# Patient Record
Sex: Female | Born: 1965 | Race: White | Hispanic: No | Marital: Married | Smoking: Never smoker
Health system: Southern US, Community
[De-identification: ages and names within clinical notes are randomized; demographics above are authoritative.]

## PROBLEM LIST (undated history)

## (undated) DIAGNOSIS — I341 Nonrheumatic mitral (valve) prolapse: Secondary | ICD-10-CM

## (undated) DIAGNOSIS — R011 Cardiac murmur, unspecified: Secondary | ICD-10-CM

## (undated) DIAGNOSIS — E039 Hypothyroidism, unspecified: Secondary | ICD-10-CM

## (undated) DIAGNOSIS — E785 Hyperlipidemia, unspecified: Secondary | ICD-10-CM

## (undated) DIAGNOSIS — E049 Nontoxic goiter, unspecified: Secondary | ICD-10-CM

## (undated) DIAGNOSIS — Z803 Family history of malignant neoplasm of breast: Secondary | ICD-10-CM

## (undated) DIAGNOSIS — G43109 Migraine with aura, not intractable, without status migrainosus: Secondary | ICD-10-CM

## (undated) HISTORY — PX: DILATION AND CURETTAGE OF UTERUS: SHX78

## (undated) HISTORY — DX: Family history of malignant neoplasm of breast: Z80.3

## (undated) HISTORY — DX: Hyperlipidemia, unspecified: E78.5

## (undated) HISTORY — DX: Nontoxic goiter, unspecified: E04.9

## (undated) HISTORY — DX: Hypothyroidism, unspecified: E03.9

## (undated) HISTORY — DX: Migraine with aura, not intractable, without status migrainosus: G43.109

---

## 2004-01-28 HISTORY — PX: THYROIDECTOMY, PARTIAL: SHX18

## 2004-02-21 ENCOUNTER — Ambulatory Visit: Payer: Self-pay

## 2004-08-05 ENCOUNTER — Ambulatory Visit: Payer: Self-pay | Admitting: Unknown Physician Specialty

## 2005-10-21 ENCOUNTER — Ambulatory Visit: Payer: Self-pay

## 2006-12-09 ENCOUNTER — Ambulatory Visit: Payer: Self-pay

## 2008-02-03 ENCOUNTER — Ambulatory Visit: Payer: Self-pay

## 2009-02-08 ENCOUNTER — Ambulatory Visit: Payer: Self-pay

## 2010-02-13 ENCOUNTER — Ambulatory Visit: Payer: Self-pay

## 2011-03-06 ENCOUNTER — Ambulatory Visit: Payer: Self-pay

## 2012-05-19 ENCOUNTER — Ambulatory Visit: Payer: Self-pay

## 2013-07-21 ENCOUNTER — Ambulatory Visit: Payer: Self-pay

## 2014-06-14 ENCOUNTER — Other Ambulatory Visit: Payer: Self-pay | Admitting: Certified Nurse Midwife

## 2014-06-14 DIAGNOSIS — Z1231 Encounter for screening mammogram for malignant neoplasm of breast: Secondary | ICD-10-CM

## 2014-07-24 ENCOUNTER — Ambulatory Visit
Admission: RE | Admit: 2014-07-24 | Discharge: 2014-07-24 | Disposition: A | Discharge status: Home / Self Care | Payer: BLUE CROSS/BLUE SHIELD | Source: Ambulatory Visit | Attending: Certified Nurse Midwife | Admitting: Certified Nurse Midwife

## 2014-07-24 DIAGNOSIS — Z1231 Encounter for screening mammogram for malignant neoplasm of breast: Secondary | ICD-10-CM | POA: Insufficient documentation

## 2015-06-28 ENCOUNTER — Other Ambulatory Visit: Payer: Self-pay | Admitting: Certified Nurse Midwife

## 2015-06-28 DIAGNOSIS — Z1231 Encounter for screening mammogram for malignant neoplasm of breast: Secondary | ICD-10-CM

## 2015-07-25 ENCOUNTER — Other Ambulatory Visit: Payer: Self-pay | Admitting: Certified Nurse Midwife

## 2015-07-25 ENCOUNTER — Ambulatory Visit
Admission: RE | Admit: 2015-07-25 | Discharge: 2015-07-25 | Disposition: A | Discharge status: Home / Self Care | Payer: BLUE CROSS/BLUE SHIELD | Source: Ambulatory Visit | Attending: Certified Nurse Midwife | Admitting: Certified Nurse Midwife

## 2015-07-25 DIAGNOSIS — Z1231 Encounter for screening mammogram for malignant neoplasm of breast: Secondary | ICD-10-CM

## 2016-06-20 ENCOUNTER — Other Ambulatory Visit: Payer: Self-pay | Admitting: Certified Nurse Midwife

## 2016-06-26 ENCOUNTER — Encounter: Payer: Self-pay | Admitting: Certified Nurse Midwife

## 2016-06-26 ENCOUNTER — Ambulatory Visit (INDEPENDENT_AMBULATORY_CARE_PROVIDER_SITE_OTHER): Payer: BLUE CROSS/BLUE SHIELD | Admitting: Certified Nurse Midwife

## 2016-06-26 VITALS — BP 110/70 | HR 69 | Ht 69.5 in | Wt 180.0 lb

## 2016-06-26 DIAGNOSIS — Z1231 Encounter for screening mammogram for malignant neoplasm of breast: Secondary | ICD-10-CM | POA: Diagnosis not present

## 2016-06-26 DIAGNOSIS — Z803 Family history of malignant neoplasm of breast: Secondary | ICD-10-CM | POA: Diagnosis not present

## 2016-06-26 DIAGNOSIS — Z1211 Encounter for screening for malignant neoplasm of colon: Secondary | ICD-10-CM

## 2016-06-26 DIAGNOSIS — Z01419 Encounter for gynecological examination (general) (routine) without abnormal findings: Secondary | ICD-10-CM

## 2016-06-26 DIAGNOSIS — Z1239 Encounter for other screening for malignant neoplasm of breast: Secondary | ICD-10-CM

## 2016-06-26 DIAGNOSIS — Z124 Encounter for screening for malignant neoplasm of cervix: Secondary | ICD-10-CM

## 2016-06-26 MED ORDER — LORYNA 3-0.02 MG PO TABS: 1.0000 | ORAL_TABLET | Freq: Every day | ORAL | 3 refills | Status: DC

## 2016-06-29 ENCOUNTER — Encounter: Payer: Self-pay | Admitting: Certified Nurse Midwife

## 2016-06-29 DIAGNOSIS — Z803 Family history of malignant neoplasm of breast: Secondary | ICD-10-CM | POA: Insufficient documentation

## 2016-06-29 DIAGNOSIS — E039 Hypothyroidism, unspecified: Secondary | ICD-10-CM | POA: Insufficient documentation

## 2016-06-29 DIAGNOSIS — E785 Hyperlipidemia, unspecified: Secondary | ICD-10-CM | POA: Insufficient documentation

## 2016-06-29 NOTE — Progress Notes (Signed)
Gynecology Annual Exam  PCP: Lenard Simmer, MD  Chief Complaint:  Chief Complaint  Patient presents with  . Gynecologic Exam    History of Present Illness: Katherine Montgomery is a 51 y.o. G0P0000 who presents for her  annual exam. The patient has no complaints today.  Her menses are irregular, as she at times has no withdrawal bleeding on her BCPs. Her flow is light, when she does have a withdrawal bleed, lasting 4-5 days. She does not have intermenstrual bleeding. Her last menstrual period was 06/21/2016. She denies dysmenorrhea. Last pap smear: 06/04/2015, results were NIL No history of abnormal Pap smears  The patient is sexually active. She currently uses [place] BCPs  for contraception. She does not have dyspareunia.  Since her last visit, she has had no significant changes in her health.  Her past medical history is remarkable for hypothyroidism and hyperlipidemia  The patient does not perform self breast exams. Her last mammogram was 07/26/15, results were negative.   There is a family history of breast cancer in her mother Genetic testing has not been done.  There is no family history of ovarian cancer.  The patient denies smoking.  She drinks alcohol occasionally.   She denies illegal drug use.  The patient reports exercising regularly.  The patient denies current symptoms of depression.    Review of Systems: Review of Systems  Constitutional: Negative for chills, fever and weight loss.  HENT: Negative for congestion, sinus pain and sore throat.   Eyes: Negative for blurred vision and pain.  Respiratory: Negative for hemoptysis, shortness of breath and wheezing.   Cardiovascular: Negative for chest pain, palpitations and leg swelling.  Gastrointestinal: Negative for abdominal pain, blood in stool, diarrhea, heartburn, nausea and vomiting.  Genitourinary: Negative for dysuria, frequency, hematuria and urgency.       Positive for irregular menses    Musculoskeletal: Negative for back pain, joint pain and myalgias.  Skin: Negative for itching and rash.  Neurological: Negative for dizziness, tingling and headaches.  Endo/Heme/Allergies: Negative for environmental allergies and polydipsia. Does not bruise/bleed easily.       Negative for hirsutism and hot flashes   Psychiatric/Behavioral: Negative for depression. The patient is not nervous/anxious and does not have insomnia.     Past Medical History:  Past Medical History:  Diagnosis Date  . Family history of breast cancer    mother age 10 - pt's lifetime risk is 27.3%  . Goiter   . Hypothyroid     Past Surgical History:  Past Surgical History:  Procedure Laterality Date  . THYROIDECTOMY, PARTIAL  2006   left hemithyroidectomy    Family History:  Family History  Problem Relation Age of Onset  . Breast cancer Mother 81  . Brain cancer Father 71  . Colon cancer Paternal Aunt 78  . Colon cancer Maternal Grandfather 80    Social History:  Social History   Social History  . Marital status: Married    Spouse name: N/A  . Number of children: 0  . Years of education: N/A   Occupational History  . Banker    Social History Main Topics  . Smoking status: Never Smoker  . Smokeless tobacco: Never Used  . Alcohol use Yes  . Drug use: No  . Sexual activity: Yes    Partners: Male    Birth control/ protection: Pill   Other Topics Concern  . Not on file   Social History Narrative  . No  narrative on file    Allergies:  No Known Allergies  Medications: Prior to Admission medications   Medication Sig Start Date End Date Taking? Authorizing Provider  levothyroxine (SYNTHROID, LEVOTHROID) 100 MCG tablet Take 100 mcg by mouth daily before breakfast.   Yes [provider]  LORYNA 3-0.02 MG tablet Take 1 tablet by mouth daily. 06/26/16   Dalia Heading, CNM    Physical Exam Vitals: Blood pressure 110/70, pulse 69, height 5' 9.5" (1.765 m), weight 180 lb  (81.6 kg), last menstrual period 06/21/2016.  General: pleasant WF in NAD HEENT: normocephalic, anicteric Neck: no thyroid enlargement, no palpable nodules, no cervical lymphadenopathy  Pulmonary: No increased work of breathing, CTAB Cardiovascular: RRR, without murmur  Breast: Breast symmetrical, no tenderness, no palpable nodules or masses, no skin or nipple retraction present, no nipple discharge.  No axillary, infraclavicular or supraclavicular lymphadenopathy. Abdomen: Soft, non-tender, non-distended.  Umbilicus without lesions.  No hepatomegaly or masses palpable. No evidence of hernia. Genitourinary:  External: Normal external female genitalia.  Normal urethral meatus, normal Bartholin's and Skene's glands.    Vagina: Normal vaginal mucosa, no evidence of prolapse.    Cervix: Grossly normal in appearance, no bleeding, non-tender  Uterus: Anteverted, normal size, shape, and consistency, mobile, and non-tender  Adnexa: No adnexal masses, non-tender  Rectal: deferred  Lymphatic: no evidence of inguinal lymphadenopathy Extremities: no edema, erythema, or tenderness Neurologic: Grossly intact Psychiatric: mood appropriate, affect full     Assessment: 51 y.o. G0P0000 well woman exam  Plan:   1) Breast cancer screening - recommend monthly self breast exam and annual 3D screening mammograms Mammogram was ordered today. Patient to schedule for after 6/28.   2) Cervical cancer screening - Pap was done. ASCCP guidelines and rational discussed.    3) Contraception - Refill Loryna x1 year  4) Routine healthcare maintenance including cholesterol and diabetes screening managed by PCP   5) Colon cancer screening-will have patient collect FIT test at home. Declines colonoscopy referral.  Dalia Heading, CNM

## 2016-06-30 LAB — IGP, APTIMA HPV
HPV APTIMA: NEGATIVE
PAP SMEAR COMMENT: 0

## 2016-07-03 ENCOUNTER — Other Ambulatory Visit: Payer: Self-pay | Admitting: Certified Nurse Midwife

## 2016-07-03 DIAGNOSIS — Z1211 Encounter for screening for malignant neoplasm of colon: Secondary | ICD-10-CM

## 2016-07-03 NOTE — Progress Notes (Signed)
Pt aware.

## 2016-07-31 ENCOUNTER — Other Ambulatory Visit: Payer: Self-pay | Admitting: Ophthalmology

## 2016-07-31 DIAGNOSIS — G453 Amaurosis fugax: Secondary | ICD-10-CM

## 2016-08-06 ENCOUNTER — Other Ambulatory Visit
Admission: RE | Admit: 2016-08-06 | Discharge: 2016-08-06 | Disposition: A | Discharge status: Home / Self Care | Payer: BLUE CROSS/BLUE SHIELD | Source: Ambulatory Visit | Attending: Ophthalmology | Admitting: Ophthalmology

## 2016-08-06 ENCOUNTER — Ambulatory Visit
Admission: RE | Admit: 2016-08-06 | Discharge: 2016-08-06 | Disposition: A | Discharge status: Home / Self Care | Payer: BLUE CROSS/BLUE SHIELD | Source: Ambulatory Visit | Attending: Ophthalmology | Admitting: Ophthalmology

## 2016-08-06 DIAGNOSIS — G453 Amaurosis fugax: Secondary | ICD-10-CM | POA: Diagnosis not present

## 2016-08-06 LAB — C-REACTIVE PROTEIN: CRP: 1 mg/dL — AB (ref <1.0)

## 2016-08-06 LAB — PLATELET COUNT: PLATELETS: 282 10*3/uL (ref 150–440)

## 2016-08-06 LAB — SEDIMENTATION RATE: Sed Rate: 43 mm/hr — ABNORMAL HIGH (ref 0–30)

## 2016-09-18 ENCOUNTER — Ambulatory Visit
Admission: RE | Admit: 2016-09-18 | Discharge: 2016-09-18 | Disposition: A | Discharge status: Home / Self Care | Payer: BLUE CROSS/BLUE SHIELD | Source: Ambulatory Visit | Attending: Certified Nurse Midwife | Admitting: Certified Nurse Midwife

## 2016-09-18 DIAGNOSIS — Z1231 Encounter for screening mammogram for malignant neoplasm of breast: Secondary | ICD-10-CM | POA: Insufficient documentation

## 2016-09-18 DIAGNOSIS — Z1239 Encounter for other screening for malignant neoplasm of breast: Secondary | ICD-10-CM

## 2016-10-14 ENCOUNTER — Other Ambulatory Visit: Payer: Self-pay | Admitting: Certified Nurse Midwife

## 2016-10-14 DIAGNOSIS — Z1211 Encounter for screening for malignant neoplasm of colon: Secondary | ICD-10-CM

## 2016-10-28 ENCOUNTER — Other Ambulatory Visit: Payer: Self-pay | Admitting: Ophthalmology

## 2016-10-28 DIAGNOSIS — H532 Diplopia: Secondary | ICD-10-CM

## 2016-11-05 ENCOUNTER — Ambulatory Visit
Admission: RE | Admit: 2016-11-05 | Discharge: 2016-11-05 | Disposition: A | Discharge status: Home / Self Care | Payer: BLUE CROSS/BLUE SHIELD | Source: Ambulatory Visit | Attending: Ophthalmology | Admitting: Ophthalmology

## 2016-11-05 DIAGNOSIS — H532 Diplopia: Secondary | ICD-10-CM | POA: Insufficient documentation

## 2016-11-05 DIAGNOSIS — J329 Chronic sinusitis, unspecified: Secondary | ICD-10-CM | POA: Diagnosis not present

## 2016-11-05 MED ORDER — GADOBENATE DIMEGLUMINE 529 MG/ML IV SOLN
17.0000 mL | Freq: Once | INTRAVENOUS | Status: AC | PRN
  Administered 2016-11-05: 17 mL via INTRAVENOUS

## 2017-07-15 ENCOUNTER — Encounter: Payer: Self-pay | Admitting: Certified Nurse Midwife

## 2017-07-15 ENCOUNTER — Ambulatory Visit (INDEPENDENT_AMBULATORY_CARE_PROVIDER_SITE_OTHER): Payer: BLUE CROSS/BLUE SHIELD | Admitting: Certified Nurse Midwife

## 2017-07-15 VITALS — BP 110/60 | HR 66 | Ht 69.5 in | Wt 182.0 lb

## 2017-07-15 DIAGNOSIS — Z1239 Encounter for other screening for malignant neoplasm of breast: Secondary | ICD-10-CM

## 2017-07-15 DIAGNOSIS — Z0000 Encounter for general adult medical examination without abnormal findings: Secondary | ICD-10-CM | POA: Diagnosis not present

## 2017-07-15 DIAGNOSIS — N951 Menopausal and female climacteric states: Secondary | ICD-10-CM

## 2017-07-15 DIAGNOSIS — Z1211 Encounter for screening for malignant neoplasm of colon: Secondary | ICD-10-CM

## 2017-07-15 DIAGNOSIS — Z01419 Encounter for gynecological examination (general) (routine) without abnormal findings: Secondary | ICD-10-CM

## 2017-07-15 DIAGNOSIS — Z1231 Encounter for screening mammogram for malignant neoplasm of breast: Secondary | ICD-10-CM

## 2017-07-15 DIAGNOSIS — G43109 Migraine with aura, not intractable, without status migrainosus: Secondary | ICD-10-CM

## 2017-07-15 DIAGNOSIS — Z124 Encounter for screening for malignant neoplasm of cervix: Secondary | ICD-10-CM | POA: Diagnosis not present

## 2017-07-15 MED ORDER — NORETHINDRONE 0.35 MG PO TABS: 1.0000 | ORAL_TABLET | Freq: Every day | ORAL | 3 refills | Status: DC

## 2017-07-15 NOTE — Progress Notes (Signed)
Gynecology Annual Exam  PCP: Lenard Simmer, MD  Chief Complaint:  Chief Complaint  Patient presents with  . Gynecologic Exam    History of Present Illness: Katherine Montgomery is a 52 y.o. G0P0000 who presents for her  annual exam. The patient has no complaints today.   Her menses are irregular, as she at times has no withdrawal bleeding on her BCPs. Her flow is light, when she does have a withdrawal bleed, lasting 1 day.. She does not have intermenstrual bleeding. Her last menstrual period was 06/16/2017. She denies dysmenorrhea. Last pap smear: 06/26/2016, results were NIL/neg HRHPV. No history of abnormal Pap smears  The patient is sexually active. She currently uses [place] BCPs  for contraception. She does not have dyspareunia.  Since her last visit, she has had 3 episodes of ocular migraine. Once she lost her vision in the left eye and two times she had double vision.She had a negative brain MRI.  Her past medical history is remarkable for hypothyroidism and hyperlipidemia  The patient does not perform self breast exams. Her last mammogram was 09/18/2016, results were negative.   There is a family history of breast cancer in her mother Genetic testing has not been done.  There is no family history of ovarian cancer.  The patient denies smoking.  She drinks alcohol occasionally.   She denies illegal drug use.  The patient reports exercising regularly by walking and biking.  The patient denies current symptoms of depression.    Review of Systems: Review of Systems  Constitutional: Negative for chills, fever and weight loss.  HENT: Negative for congestion, sinus pain and sore throat.   Eyes: Positive for double vision (episodes x 2 (ocular migraines) in the past year.). Negative for blurred vision and pain.  Respiratory: Negative for hemoptysis, shortness of breath and wheezing.   Cardiovascular: Negative for chest pain, palpitations and leg swelling.    Gastrointestinal: Negative for abdominal pain, blood in stool, diarrhea, heartburn, nausea and vomiting.  Genitourinary: Negative for dysuria, frequency, hematuria and urgency.       Positive for irregular menses  Musculoskeletal: Negative for back pain, joint pain and myalgias.  Skin: Negative for itching and rash.  Neurological: Negative for dizziness, tingling and headaches.  Endo/Heme/Allergies: Negative for environmental allergies and polydipsia. Does not bruise/bleed easily.       Negative for hirsutism and hot flashes   Psychiatric/Behavioral: Negative for depression. The patient is not nervous/anxious and does not have insomnia.     Past Medical History:  Past Medical History:  Diagnosis Date  . Family history of breast cancer    mother age 60 - pt's lifetime risk is 27.3%  . Goiter   . Hyperlipidemia   . Hypothyroid   . Ocular migraine     Past Surgical History:  Past Surgical History:  Procedure Laterality Date  . THYROIDECTOMY, PARTIAL  2006   left hemithyroidectomy    Family History:  Family History  Problem Relation Age of Onset  . Breast cancer Mother 48  . Brain cancer Father 35  . Colon cancer Paternal Aunt 78  . Colon cancer Maternal Grandfather 80    Social History:  Social History   Socioeconomic History  . Marital status: Married    Spouse name: Not on file  . Number of children: 0  . Years of education: 42  . Highest education level: Not on file  Occupational History  . Occupation: Visual merchandiser  . Financial  resource strain: Not on file  . Food insecurity:    Worry: Not on file    Inability: Not on file  . Transportation needs:    Medical: Not on file    Non-medical: Not on file  Tobacco Use  . Smoking status: Never Smoker  . Smokeless tobacco: Never Used  Substance and Sexual Activity  . Alcohol use: Yes  . Drug use: No  . Sexual activity: Yes    Partners: Male    Birth control/protection: Pill  Lifestyle  . Physical  activity:    Days per week: 3 days    Minutes per session: 60 min  . Stress: Not at all  Relationships  . Social connections:    Talks on phone: Not on file    Gets together: Not on file    Attends religious service: Not on file    Active member of club or organization: Not on file    Attends meetings of clubs or organizations: Not on file    Relationship status: Not on file  . Intimate partner violence:    Fear of current or ex partner: Not on file    Emotionally abused: Not on file    Physically abused: Not on file    Forced sexual activity: Not on file  Other Topics Concern  . Not on file  Social History Narrative  . Not on file    Allergies:  No Known Allergies  Medications: [place] BCPs and  Current Outpatient Medications on File Prior to Visit  Medication Sig Dispense Refill  . levothyroxine (SYNTHROID, LEVOTHROID) 112 MCG tablet Take 112 mcg by mouth daily before breakfast.    . rosuvastatin (CRESTOR) 40 MG tablet Take 40 mg by mouth daily.     No current facility-administered medications on file prior to visit.    Physical Exam Vitals: BP 110/60   Pulse 66   Ht 5' 9.5" (1.765 m)   Wt 182 lb (82.6 kg)   LMP 06/16/2017 (Approximate)   BMI 26.49 kg/m   General: pleasant WF in NAD HEENT: normocephalic, anicteric Neck: no thyroid enlargement, no palpable nodules, no cervical lymphadenopathy  Pulmonary: No increased work of breathing, CTAB Cardiovascular: RRR, without murmur  Breast: Breast symmetrical, no tenderness, no palpable nodules or masses, no skin or nipple retraction present, no nipple discharge.  No axillary, infraclavicular or supraclavicular lymphadenopathy. Abdomen: Soft, non-tender, non-distended.  Umbilicus without lesions.  No hepatomegaly or masses palpable. No evidence of hernia. Genitourinary:  External: Normal external female genitalia.  Normal urethral meatus, normal Bartholin's and Skene's glands.    Vagina: Normal vaginal mucosa, no  evidence of prolapse.    Cervix: Grossly normal in appearance, no bleeding, non-tender  Uterus: Anteverted, normal size, shape, and consistency, mobile, and non-tender  Adnexa: No adnexal masses, non-tender  Rectal: deferred  Lymphatic: no evidence of inguinal lymphadenopathy Extremities: no edema, erythema, or tenderness Neurologic: Grossly intact Psychiatric: mood appropriate, affect full     Assessment: 52 y.o. G0P0000 well woman exam Ocular migraine - Oligomenorrhea on BCPs-probable perimenopausal   Plan:   1) Breast cancer screening - recommend monthly self breast exam and annual 3D screening mammograms Mammogram was ordered today. Patient to schedule for after 8/23  2) Cervical cancer screening - Pap was done. ASCCP guidelines and rational discussed.    3) Contraception - Explained increased risk of stroke while on OCPs with ocular migraines. Recommend switching to progesterone only pills. In addition patient to get [facility] and LH drawn while off  pills. If FSH and LH elevated can stop contraception after another year. Explained how to take POP and difference in MOA c/w OCPs. Also advised of 3 hour rule. Recommend back up contraception until on POP x 1 week.  4) Routine healthcare maintenance including cholesterol and diabetes screening managed by PCP   5) Colon cancer screening-desires referral for colonoscopy  Dalia Heading, CNM

## 2017-07-19 ENCOUNTER — Encounter: Payer: Self-pay | Admitting: Certified Nurse Midwife

## 2017-07-19 DIAGNOSIS — G43109 Migraine with aura, not intractable, without status migrainosus: Secondary | ICD-10-CM | POA: Insufficient documentation

## 2017-07-21 ENCOUNTER — Other Ambulatory Visit: Payer: BLUE CROSS/BLUE SHIELD

## 2017-07-21 DIAGNOSIS — N951 Menopausal and female climacteric states: Secondary | ICD-10-CM

## 2017-07-21 LAB — IGP,RFX APTIMA HPV ALL PTH: PAP SMEAR COMMENT: 0

## 2017-07-22 ENCOUNTER — Encounter (INDEPENDENT_AMBULATORY_CARE_PROVIDER_SITE_OTHER): Payer: Self-pay

## 2017-07-22 LAB — FSH/LH
FSH: 29.4 m[IU]/mL
LH: 15.2 m[IU]/mL

## 2017-08-06 ENCOUNTER — Other Ambulatory Visit: Payer: Self-pay

## 2017-08-06 DIAGNOSIS — Z1211 Encounter for screening for malignant neoplasm of colon: Secondary | ICD-10-CM

## 2017-09-01 ENCOUNTER — Telehealth: Payer: Self-pay | Admitting: Gastroenterology

## 2017-09-01 NOTE — Telephone Encounter (Signed)
Patient states she did not receive a prescription for suprep via mychart and would like it sent in to [facility] in [place]. Patient has procedure on 8.30.19.

## 2017-09-02 ENCOUNTER — Other Ambulatory Visit: Payer: Self-pay

## 2017-09-02 MED ORDER — NA SULFATE-K SULFATE-MG SULF 17.5-3.13-1.6 GM/177ML PO SOLN: 1.0000 | Freq: Once | ORAL | 0 refills | Status: AC

## 2017-09-02 NOTE — Telephone Encounter (Signed)
Patient notified rx has been sent to [facility] for [place] and she may disregard the rx she receives in the mail.  Thanks [employer]

## 2017-09-24 ENCOUNTER — Encounter: Payer: Self-pay | Admitting: *Deleted

## 2017-09-25 ENCOUNTER — Ambulatory Visit: Payer: BLUE CROSS/BLUE SHIELD | Admitting: Anesthesiology

## 2017-09-25 ENCOUNTER — Ambulatory Visit
Admission: RE | Admit: 2017-09-25 | Discharge: 2017-09-25 | Disposition: A | Discharge status: Home / Self Care | Payer: BLUE CROSS/BLUE SHIELD | Source: Ambulatory Visit | Attending: Gastroenterology | Admitting: Gastroenterology

## 2017-09-25 ENCOUNTER — Encounter: Payer: Self-pay | Admitting: Anesthesiology

## 2017-09-25 ENCOUNTER — Encounter
Admission: RE | Disposition: A | Discharge status: Home / Self Care | Payer: Self-pay | Source: Ambulatory Visit | Attending: Gastroenterology

## 2017-09-25 DIAGNOSIS — D120 Benign neoplasm of cecum: Secondary | ICD-10-CM | POA: Diagnosis not present

## 2017-09-25 DIAGNOSIS — Z793 Long term (current) use of hormonal contraceptives: Secondary | ICD-10-CM | POA: Diagnosis not present

## 2017-09-25 DIAGNOSIS — K6289 Other specified diseases of anus and rectum: Secondary | ICD-10-CM | POA: Insufficient documentation

## 2017-09-25 DIAGNOSIS — E890 Postprocedural hypothyroidism: Secondary | ICD-10-CM | POA: Diagnosis not present

## 2017-09-25 DIAGNOSIS — E785 Hyperlipidemia, unspecified: Secondary | ICD-10-CM | POA: Insufficient documentation

## 2017-09-25 DIAGNOSIS — Z79899 Other long term (current) drug therapy: Secondary | ICD-10-CM | POA: Insufficient documentation

## 2017-09-25 DIAGNOSIS — Z1211 Encounter for screening for malignant neoplasm of colon: Secondary | ICD-10-CM

## 2017-09-25 DIAGNOSIS — D122 Benign neoplasm of ascending colon: Secondary | ICD-10-CM

## 2017-09-25 HISTORY — PX: COLONOSCOPY WITH PROPOFOL: SHX5780

## 2017-09-25 LAB — POCT PREGNANCY, URINE: Preg Test, Ur: NEGATIVE

## 2017-09-25 SURGERY — COLONOSCOPY WITH PROPOFOL
Anesthesia: General

## 2017-09-25 MED ORDER — PROPOFOL 10 MG/ML IV BOLUS
INTRAVENOUS | Status: DC | PRN
  Administered 2017-09-25: 100 mg via INTRAVENOUS
  Administered 2017-09-25: 40 mg via INTRAVENOUS

## 2017-09-25 MED ORDER — PROPOFOL 500 MG/50ML IV EMUL
INTRAVENOUS | Status: DC | PRN
  Administered 2017-09-25: 140 ug/kg/min via INTRAVENOUS

## 2017-09-25 MED ORDER — PROPOFOL 500 MG/50ML IV EMUL
INTRAVENOUS | Status: AC
  Filled 2017-09-25: qty 50

## 2017-09-25 MED ORDER — SODIUM CHLORIDE 0.9 % IV SOLN
INTRAVENOUS | Status: DC
  Administered 2017-09-25: 1000 mL via INTRAVENOUS

## 2017-09-25 NOTE — Op Note (Signed)
[facility] Gastroenterology Patient Name: Katherine Montgomery Procedure Date: 09/25/2017 11:42 AM MRN: [id number] Account #: [id number] Date of Birth: [birth year] Admit Type: Outpatient Age: 52 Room: [facility] ENDO ROOM 2 Gender: Female Note Status: Finalized Procedure:            Colonoscopy Indications:          Screening for colorectal malignant neoplasm Providers:             B. Bonna Gains MD, MD Referring MD:         Forest Gleason Md, MD (Referring MD) Medicines:            Monitored Anesthesia Care Complications:        No immediate complications. Procedure:            Pre-Anesthesia Assessment:                       - ASA Grade Assessment: II - A patient with mild                        systemic disease.                       - Prior to the procedure, a History and Physical was                        performed, and patient medications, allergies and                        sensitivities were reviewed. The patient's tolerance of                        previous anesthesia was reviewed.                       - The risks and benefits of the procedure and the                        sedation options and risks were discussed with the                        patient. All questions were answered and informed                        consent was obtained.                       - Patient identification and proposed procedure were                        verified prior to the procedure by the physician, the                        nurse, the anesthesiologist, the anesthetist and the                        technician. The procedure was verified in the procedure                        room.  After obtaining informed consent, the colonoscope was                        passed under direct vision. Throughout the procedure,                        the patient's blood pressure, pulse, and oxygen                        saturations were monitored continuously. The                 Colonoscope was introduced through the anus and                        advanced to the the cecum, identified by appendiceal                        orifice and ileocecal valve. The colonoscopy was                        performed with ease. The patient tolerated the                        procedure well. The quality of the bowel preparation                        was good except the ascending colon was fair and the                        cecum was fair. Findings:      The perianal and digital rectal examinations were normal.      A 4 mm polyp was found in the cecum. The polyp was sessile. The polyp       was removed with a cold biopsy forceps. Resection and retrieval were       complete.      A 5 mm polyp was found in the ascending colon. The polyp was sessile.       The polyp was removed with a cold snare. Resection and retrieval were       complete.      The exam was otherwise without abnormality.      The rectum, sigmoid colon, descending colon, transverse colon, ascending       colon and cecum appeared normal.      Anal papilla(e) were hypertrophied.      The retroflexed view of the distal rectum and anal verge was normal and       showed no anal or rectal abnormalities besides hypertrophied anal       papillae. Impression:           - One 4 mm polyp in the cecum, removed with a cold                        biopsy forceps. Resected and retrieved.                       - One 5 mm polyp in the ascending colon, removed with a                        cold snare. Resected and retrieved.                       -  The examination was otherwise normal.                       - The rectum, sigmoid colon, descending colon,                        transverse colon, ascending colon and cecum are normal.                       - Anal papilla(e) were hypertrophied.                       - The distal rectum and anal verge are normal on                        retroflexion view besides the  above. Recommendation:       - Discharge patient to home (with escort).                       - Advance diet as tolerated.                       - Continue present medications.                       - Await pathology results.                       - Repeat colonoscopy in 3 years for surveillance due to                        fair prep on today's exam.                       - The findings and recommendations were discussed with                        the patient.                       - The findings and recommendations were discussed with                        the patient's family.                       - Return to primary care physician as previously                        scheduled. Procedure Code(s):    --- Professional ---                       [phone], Colonoscopy, flexible; with removal of tumor(s),                        polyp(s), or other lesion(s) by snare technique                       19622, 48, Colonoscopy, flexible; with biopsy, single                        or multiple Diagnosis Code(s):    --- Professional ---  Z12.11, Encounter for screening for malignant neoplasm                        of colon                       D12.0, Benign neoplasm of cecum                       D12.2, Benign neoplasm of ascending colon                       K62.89, Other specified diseases of anus and rectum CPT copyright 2017 American Medical Association. All rights reserved. The codes documented in this report are preliminary and upon coder review may  be revised to meet current compliance requirements.  Vonda Antigua, MD Margretta Sidle B. Bonna Gains MD, MD 09/25/2017 12:25:30 PM This report has been signed electronically. Number of Addenda: 0 Note Initiated On: 09/25/2017 11:42 AM Scope Withdrawal Time: 0 hours 17 minutes 4 seconds  Total Procedure Duration: 0 hours 31 minutes 45 seconds  Estimated Blood Loss: Estimated blood loss: none.      [facility]

## 2017-09-25 NOTE — Transfer of Care (Signed)
Immediate Anesthesia Transfer of Care Note  Patient: Katherine Montgomery  Procedure(s) Performed: COLONOSCOPY WITH PROPOFOL (N/A )  Patient Location: Endoscopy Unit  Anesthesia Type:General  Level of Consciousness: awake  Airway & Oxygen Therapy: Patient Spontanous Breathing  Post-op Assessment: Report given to RN and Post -op Vital signs reviewed and stable  Post vital signs: Reviewed and stable  Last Vitals:  Vitals Value Taken Time  BP    Temp    Pulse 76 09/25/2017 12:24 PM  Resp 14 09/25/2017 12:24 PM  SpO2 100 % 09/25/2017 12:24 PM  Vitals shown include unvalidated device data.  Last Pain:  Vitals:   09/25/17 1036  TempSrc: Tympanic  PainSc: 0-No pain         Complications: No apparent anesthesia complications

## 2017-09-25 NOTE — Anesthesia Post-op Follow-up Note (Signed)
Anesthesia QCDR form completed.        

## 2017-09-25 NOTE — Anesthesia Preprocedure Evaluation (Addendum)
Anesthesia Evaluation  Patient identified by MRN, date of birth, ID band Patient awake    Reviewed: Allergy & Precautions, H&P , NPO status , Patient's Chart, lab work & pertinent test results  History of Anesthesia Complications Negative for: history of anesthetic complications  Airway Mallampati: II  TM Distance: >3 FB Neck ROM: full    Dental  (+) Chipped   Pulmonary neg pulmonary ROS, neg shortness of breath,           Cardiovascular Exercise Tolerance: Good (-) angina(-) Past MI and (-) DOE negative cardio ROS       Neuro/Psych  Headaches, negative psych ROS   GI/Hepatic negative GI ROS, Neg liver ROS, neg GERD  ,  Endo/Other  Hypothyroidism   Renal/GU negative Renal ROS  negative genitourinary   Musculoskeletal   Abdominal   Peds  Hematology negative hematology ROS (+)   Anesthesia Other Findings Past Medical History: No date: Family history of breast cancer     Comment:  mother age 89 - pt's lifetime risk is 27.3% No date: Goiter No date: Hyperlipidemia No date: Hypothyroid No date: Ocular migraine  Past Surgical History: 2006: THYROIDECTOMY, PARTIAL     Comment:  left hemithyroidectomy  BMI    Body Mass Index:  26.20 kg/m      Reproductive/Obstetrics negative OB ROS                             Anesthesia Physical Anesthesia Plan  ASA: III  Anesthesia Plan: General   Post-op Pain Management:    Induction: Intravenous  PONV Risk Score and Plan: Propofol infusion and TIVA  Airway Management Planned: Natural Airway and Nasal Cannula  Additional Equipment:   Intra-op Plan:   Post-operative Plan:   Informed Consent: I have reviewed the patients History and Physical, chart, labs and discussed the procedure including the risks, benefits and alternatives for the proposed anesthesia with the patient or authorized representative who has indicated his/her  understanding and acceptance.   Dental Advisory Given  Plan Discussed with: Anesthesiologist, CRNA and Surgeon  Anesthesia Plan Comments: (Patient consented for risks of anesthesia including but not limited to:  - adverse reactions to medications - risk of intubation if required - damage to teeth, lips or other oral mucosa - sore throat or hoarseness - Damage to heart, brain, lungs or loss of life  Patient voiced understanding.)        Anesthesia Quick Evaluation

## 2017-09-25 NOTE — H&P (Signed)
Vonda Antigua, MD [address] [place], [place], [place], [address] [id number], [place], [place], [place], [address] Phone: [phone]  Fax: [phone]  Primary Care Physician:  Lenard Simmer, MD   Pre-Procedure History & Physical: HPI:  Katherine Montgomery is a 52 y.o. female is here for a colonoscopy.   Past Medical History:  Diagnosis Date  . Family history of breast cancer    mother age 66 - pt's lifetime risk is 27.3%  . Goiter   . Hyperlipidemia   . Hypothyroid   . Ocular migraine     Past Surgical History:  Procedure Laterality Date  . THYROIDECTOMY, PARTIAL  2006   left hemithyroidectomy    Prior to Admission medications   Medication Sig Start Date End Date Taking? Authorizing Provider  levothyroxine (SYNTHROID, LEVOTHROID) 112 MCG tablet Take 112 mcg by mouth daily before breakfast.   Yes [provider]  norethindrone (MICRONOR,CAMILA,ERRIN) 0.35 MG tablet Take 1 tablet (0.35 mg total) by mouth daily. 07/15/17  Yes Dalia Heading, CNM  rosuvastatin (CRESTOR) 40 MG tablet Take 40 mg by mouth daily.   Yes [provider]    Allergies as of 08/06/2017  . (No Known Allergies)    Family History  Problem Relation Age of Onset  . Breast cancer Mother 44  . Brain cancer Father 64  . Colon cancer Paternal Aunt 78  . Colon cancer Maternal Grandfather 54    Social History   Socioeconomic History  . Marital status: Married    Spouse name: Not on file  . Number of children: 0  . Years of education: 1  . Highest education level: Not on file  Occupational History  . Occupation: Visual merchandiser  . Financial resource strain: Not on file  . Food insecurity:    Worry: Not on file    Inability: Not on file  . Transportation needs:    Medical: Not on file    Non-medical: Not on file  Tobacco Use  . Smoking status: Never Smoker  . Smokeless tobacco: Never Used  Substance and Sexual Activity  . Alcohol use: Yes  . Drug  use: No  . Sexual activity: Yes    Partners: Male    Birth control/protection: Pill  Lifestyle  . Physical activity:    Days per week: 3 days    Minutes per session: 60 min  . Stress: Not at all  Relationships  . Social connections:    Talks on phone: Not on file    Gets together: Not on file    Attends religious service: Not on file    Active member of club or organization: Not on file    Attends meetings of clubs or organizations: Not on file    Relationship status: Not on file  . Intimate partner violence:    Fear of current or ex partner: Not on file    Emotionally abused: Not on file    Physically abused: Not on file    Forced sexual activity: Not on file  Other Topics Concern  . Not on file  Social History Narrative  . Not on file    Review of Systems: See HPI, otherwise negative ROS  Physical Exam: BP 114/76   Pulse 73   Temp (!) 96.3 F (35.7 C) (Tympanic)   Resp 18   Ht 5' 9.5" (1.765 m)   Wt 81.6 kg   SpO2 100%   BMI 26.20 kg/m  General:   Alert,  pleasant and cooperative in NAD Head:  Normocephalic and atraumatic. Neck:  Supple; no masses or thyromegaly. Lungs:  Clear throughout to auscultation, normal respiratory effort.    Heart:  +S1, +S2, Regular rate and rhythm, No edema. Abdomen:  Soft, nontender and nondistended. Normal bowel sounds, without guarding, and without rebound.   Neurologic:  Alert and  oriented x4;  grossly normal neurologically.  Impression/Plan: Katherine Montgomery is here for a colonoscopy to be performed for average risk screening.  Risks, benefits, limitations, and alternatives regarding  colonoscopy have been reviewed with the patient.  Questions have been answered.  All parties agreeable.   Virgel Manifold, MD  09/25/2017, 11:34 AM

## 2017-09-25 NOTE — Anesthesia Postprocedure Evaluation (Deleted)
Anesthesia Post Note  Patient: Katherine Montgomery  Procedure(s) Performed: COLONOSCOPY WITH PROPOFOL (N/A )  Patient location during evaluation: Endoscopy Anesthesia Type: General Level of consciousness: awake and alert Pain management: pain level controlled Vital Signs Assessment: post-procedure vital signs reviewed and stable Respiratory status: spontaneous breathing, nonlabored ventilation, respiratory function stable and patient connected to nasal cannula oxygen Cardiovascular status: blood pressure returned to baseline and stable Postop Assessment: no apparent nausea or vomiting Anesthetic complications: no     Last Vitals:  Vitals:   09/25/17 1036  BP: 114/76  Pulse: 73  Resp: 18  Temp: (!) 35.7 C  SpO2: 100%    Last Pain:  Vitals:   09/25/17 1036  TempSrc: Tympanic  PainSc: 0-No pain                 Precious Haws Breton Berns

## 2017-09-26 NOTE — Anesthesia Postprocedure Evaluation (Signed)
Anesthesia Post Note  Patient: Meda Dudzinski  Procedure(s) Performed: COLONOSCOPY WITH PROPOFOL (N/A )  Patient location during evaluation: Endoscopy Anesthesia Type: General Level of consciousness: awake and alert Pain management: pain level controlled Vital Signs Assessment: post-procedure vital signs reviewed and stable Respiratory status: spontaneous breathing, nonlabored ventilation, respiratory function stable and patient connected to nasal cannula oxygen Cardiovascular status: blood pressure returned to baseline and stable Postop Assessment: no apparent nausea or vomiting Anesthetic complications: no     Last Vitals:  Vitals:   09/25/17 1244 09/25/17 1254  BP: 116/66 113/82  Pulse: 67 63  Resp: 20 10  Temp:    SpO2: 100% 100%    Last Pain:  Vitals:   09/25/17 1254  TempSrc:   PainSc: 0-No pain                 Precious Haws [place]

## 2017-09-29 ENCOUNTER — Encounter: Payer: Self-pay | Admitting: Gastroenterology

## 2017-09-29 LAB — SURGICAL PATHOLOGY

## 2017-10-05 ENCOUNTER — Encounter: Payer: Self-pay | Admitting: Gastroenterology

## 2017-10-22 ENCOUNTER — Other Ambulatory Visit: Payer: Self-pay | Admitting: Certified Nurse Midwife

## 2017-10-22 DIAGNOSIS — Z1239 Encounter for other screening for malignant neoplasm of breast: Secondary | ICD-10-CM

## 2017-11-05 ENCOUNTER — Ambulatory Visit
Admission: RE | Admit: 2017-11-05 | Discharge: 2017-11-05 | Disposition: A | Discharge status: Home / Self Care | Payer: BLUE CROSS/BLUE SHIELD | Source: Ambulatory Visit | Attending: Certified Nurse Midwife | Admitting: Certified Nurse Midwife

## 2017-11-05 DIAGNOSIS — Z1239 Encounter for other screening for malignant neoplasm of breast: Secondary | ICD-10-CM | POA: Insufficient documentation

## 2017-11-09 ENCOUNTER — Other Ambulatory Visit: Payer: Self-pay | Admitting: Certified Nurse Midwife

## 2017-11-09 DIAGNOSIS — N6489 Other specified disorders of breast: Secondary | ICD-10-CM

## 2017-11-09 DIAGNOSIS — R928 Other abnormal and inconclusive findings on diagnostic imaging of breast: Secondary | ICD-10-CM

## 2017-11-30 ENCOUNTER — Ambulatory Visit
Admission: RE | Admit: 2017-11-30 | Discharge: 2017-11-30 | Disposition: A | Discharge status: Home / Self Care | Payer: BLUE CROSS/BLUE SHIELD | Source: Ambulatory Visit | Attending: Certified Nurse Midwife | Admitting: Certified Nurse Midwife

## 2017-11-30 DIAGNOSIS — R928 Other abnormal and inconclusive findings on diagnostic imaging of breast: Secondary | ICD-10-CM

## 2017-11-30 DIAGNOSIS — N6489 Other specified disorders of breast: Secondary | ICD-10-CM | POA: Insufficient documentation

## 2018-02-12 ENCOUNTER — Other Ambulatory Visit: Payer: Self-pay | Admitting: Sports Medicine

## 2018-02-12 DIAGNOSIS — G8929 Other chronic pain: Secondary | ICD-10-CM

## 2018-02-12 DIAGNOSIS — M25512 Pain in left shoulder: Principal | ICD-10-CM

## 2018-02-25 ENCOUNTER — Ambulatory Visit
Admission: RE | Admit: 2018-02-25 | Discharge: 2018-02-25 | Disposition: A | Discharge status: Home / Self Care | Payer: BLUE CROSS/BLUE SHIELD | Source: Ambulatory Visit | Attending: Sports Medicine | Admitting: Sports Medicine

## 2018-02-25 DIAGNOSIS — M25512 Pain in left shoulder: Secondary | ICD-10-CM | POA: Diagnosis not present

## 2018-02-25 DIAGNOSIS — G8929 Other chronic pain: Secondary | ICD-10-CM | POA: Insufficient documentation

## 2018-05-22 ENCOUNTER — Other Ambulatory Visit: Payer: Self-pay | Admitting: Certified Nurse Midwife

## 2018-08-19 ENCOUNTER — Encounter: Payer: Self-pay | Admitting: Certified Nurse Midwife

## 2018-08-19 ENCOUNTER — Ambulatory Visit (INDEPENDENT_AMBULATORY_CARE_PROVIDER_SITE_OTHER): Payer: BC Managed Care – PPO | Admitting: Certified Nurse Midwife

## 2018-08-19 ENCOUNTER — Other Ambulatory Visit: Payer: Self-pay

## 2018-08-19 VITALS — BP 100/60 | Ht 70.0 in | Wt 173.0 lb

## 2018-08-19 DIAGNOSIS — Z124 Encounter for screening for malignant neoplasm of cervix: Secondary | ICD-10-CM

## 2018-08-19 DIAGNOSIS — Z01419 Encounter for gynecological examination (general) (routine) without abnormal findings: Secondary | ICD-10-CM

## 2018-08-19 DIAGNOSIS — Z1239 Encounter for other screening for malignant neoplasm of breast: Secondary | ICD-10-CM

## 2018-08-19 MED ORDER — NORETHINDRONE 0.35 MG PO TABS: ORAL_TABLET | ORAL | 3 refills | Status: DC

## 2018-08-19 NOTE — Progress Notes (Signed)
Gynecology Annual Exam  PCP: Lenard Simmer, MD  Chief Complaint:  No chief complaint on file.   History of Present Illness: Katherine Montgomery is a 53 y.o. G0P0000 who presents for her  annual exam. The patient has no complaints today.   Her menses are irregular. She was switched to a progesterone only pill last year due to ocular migraines. She has not had a migraine since then. Her menses are usually every month and last 3-5 days and the flow is light to medium. In February her menses lasted 2 weeks and she has not had a menses in June or so far in July. Her last menstrual period was 05/2018. She had mildly elevated FSH and LH last year (not in menopause range). She denies dysmenorrhea. Last pap smear: 07/15/2017, results were NIL.  No history of abnormal Pap smears  The patient is sexually active. She currently uses a progesterone only pill  for contraception. She does not have dyspareunia.  Since her last visit, she has had problems with a frozen left shoulder. She reports gaining back most of her ROM after a cortisone injection and some PT. Her mother had a stroke at age 26 and is out of rehab  Her past medical history is remarkable for hypothyroidism, ocular migraines, and hyperlipidemia  The patient does perform occ self breast exams. Her last mammogram was 11/05/2017, results were negative, after additional views on the left breast were done  There is a family history of breast cancer in her mother Genetic testing has not been done.  There is no family history of ovarian cancer.   She had a colonoscopy done 09/25/2017. There were 2 tubular adenomas removed and her next colonoscopy is in 3 years (2022).  The patient denies smoking.  She drinks alcohol occasionally. She drinks beer on the weekends.   She denies illegal drug use.  The patient reports exercising regularly by walking. She has decreased her walking to twice a week due to the heat.  The patient denies  current symptoms of depression.    Review of Systems: Review of Systems  Constitutional: Negative for chills, fever and weight loss.  HENT: Negative for congestion, sinus pain and sore throat.   Eyes: Negative for blurred vision, double vision and pain.  Respiratory: Negative for hemoptysis, shortness of breath and wheezing.   Cardiovascular: Negative for chest pain, palpitations and leg swelling.  Gastrointestinal: Negative for abdominal pain, blood in stool, diarrhea, heartburn, nausea and vomiting.  Genitourinary: Negative for dysuria, frequency, hematuria and urgency.       Positive for irregular menses  Musculoskeletal: Positive for joint pain (left shoulder with some loss of ROM). Negative for back pain and myalgias.  Skin: Negative for itching and rash.  Neurological: Negative for dizziness, tingling and headaches.  Endo/Heme/Allergies: Negative for environmental allergies and polydipsia. Does not bruise/bleed easily.       Negative for hirsutism and hot flashes   Psychiatric/Behavioral: Negative for depression. The patient is not nervous/anxious and does not have insomnia.     Past Medical History:  Past Medical History:  Diagnosis Date  . Family history of breast cancer    mother age 56 - pt's lifetime risk is 27.3%  . Goiter   . Hyperlipidemia   . Hypothyroid   . Ocular migraine     Past Surgical History:  Past Surgical History:  Procedure Laterality Date  . COLONOSCOPY WITH PROPOFOL N/A 09/25/2017   Procedure: COLONOSCOPY WITH PROPOFOL;  Surgeon: Virgel Manifold, MD;  Location: [facility] ENDOSCOPY;  Service: Endoscopy;  Laterality: N/A;  . THYROIDECTOMY, PARTIAL  2006   left hemithyroidectomy    Family History:  Family History  Problem Relation Age of Onset  . Breast cancer Mother 19  . Brain cancer Father 1  . Colon cancer Paternal Aunt 78  . Colon cancer Maternal Grandfather 80    Social History:  Social History   Socioeconomic History  . Marital  status: Married    Spouse name: Not on file  . Number of children: 0  . Years of education: 31  . Highest education level: Not on file  Occupational History  . Occupation: Visual merchandiser  . Financial resource strain: Not on file  . Food insecurity    Worry: Not on file    Inability: Not on file  . Transportation needs    Medical: Not on file    Non-medical: Not on file  Tobacco Use  . Smoking status: Never Smoker  . Smokeless tobacco: Never Used  Substance and Sexual Activity  . Alcohol use: Yes  . Drug use: No  . Sexual activity: Yes    Partners: Male    Birth control/protection: Pill  Lifestyle  . Physical activity    Days per week: 3 days    Minutes per session: 60 min  . Stress: Not at all  Relationships  . Social Herbalist on phone: Not on file    Gets together: Not on file    Attends religious service: Not on file    Active member of club or organization: Not on file    Attends meetings of clubs or organizations: Not on file    Relationship status: Not on file  . Intimate partner violence    Fear of current or ex partner: Not on file    Emotionally abused: Not on file    Physically abused: Not on file    Forced sexual activity: Not on file  Other Topics Concern  . Not on file  Social History Narrative  . Not on file    Allergies:  No Known Allergies  Medications: [place] BCPs and  Current Outpatient Medications on File Prior to Visit  Medication Sig Dispense Refill  . levothyroxine (SYNTHROID, LEVOTHROID) 112 MCG tablet Take 112 mcg by mouth daily before breakfast.    . NORLYDA 0.35 MG tablet TAKE 1 TABLET(0.35 MG) BY MOUTH DAILY 84 tablet 1  . rosuvastatin (CRESTOR) 40 MG tablet Take 40 mg by mouth daily.     No current facility-administered medications on file prior to visit.    Physical Exam Vitals: BP 100/60   Ht 5\' 10"  (1.778 m)   Wt 173 lb (78.5 kg)   BMI 24.82 kg/m  General: pleasant WF in NAD HEENT: normocephalic,  anicteric Neck: no thyroid enlargement, no palpable nodules, no cervical lymphadenopathy  Pulmonary: No increased work of breathing, CTAB Cardiovascular: RRR, without murmur  Breast: Breast symmetrical, no tenderness, no palpable nodules or masses, no skin or nipple retraction present, no nipple discharge.  No axillary, infraclavicular or supraclavicular lymphadenopathy. Abdomen: Soft, non-tender, non-distended.  Umbilicus without lesions.  No hepatomegaly or masses palpable. No evidence of hernia. Genitourinary:  External: Normal external female genitalia.  Normal urethral meatus, normal Bartholin's and Skene's glands.    Vagina: Normal vaginal mucosa, no evidence of prolapse, white mucoepithelial discharge.    Cervix: Grossly normal in appearance, no bleeding, non-tender  Uterus: Anteverted, normal size,  shape, and consistency, mobile, and non-tender  Adnexa: No adnexal masses, non-tender  Rectal: deferred  Lymphatic: no evidence of inguinal lymphadenopathy Extremities: no edema, erythema, or tenderness Neurologic: Grossly intact Psychiatric: mood appropriate, affect full     Assessment: 53 y.o. G0P0000 well woman exam Perimenopausal bleeding  Plan:   1) Breast cancer screening - recommend monthly self breast exam and annual 3D screening mammograms Mammogram was ordered today. Patient to schedule for after 11/06/2018  2) Cervical cancer screening - Pap was done. ASCCP guidelines and rational discussed.    3) Contraception - continue progesterone only pills  4) Routine healthcare maintenance including cholesterol and diabetes screening managed by PCP   5) Colon cancer screening-colonoscopy up to date. Next due in 2022  6) Discussed the role of exercise in preventing osteoporosis  7) RTO 1 year and prn.  Dalia Heading, CNM

## 2018-08-24 LAB — IGP,RFX APTIMA HPV ALL PTH

## 2019-05-17 ENCOUNTER — Ambulatory Visit
Admission: RE | Admit: 2019-05-17 | Discharge: 2019-05-17 | Disposition: A | Discharge status: Home / Self Care | Payer: BC Managed Care – PPO | Source: Ambulatory Visit | Attending: Certified Nurse Midwife | Admitting: Certified Nurse Midwife

## 2019-05-17 DIAGNOSIS — Z1231 Encounter for screening mammogram for malignant neoplasm of breast: Secondary | ICD-10-CM | POA: Diagnosis not present

## 2019-05-17 DIAGNOSIS — Z1239 Encounter for other screening for malignant neoplasm of breast: Secondary | ICD-10-CM

## 2019-05-18 ENCOUNTER — Other Ambulatory Visit: Payer: Self-pay | Admitting: Certified Nurse Midwife

## 2019-05-25 ENCOUNTER — Encounter: Payer: Self-pay | Admitting: Certified Nurse Midwife

## 2019-06-03 ENCOUNTER — Ambulatory Visit: Payer: BLUE CROSS/BLUE SHIELD | Admitting: Dermatology

## 2019-07-13 ENCOUNTER — Ambulatory Visit: Payer: BC Managed Care – PPO | Admitting: Dermatology

## 2019-07-13 ENCOUNTER — Other Ambulatory Visit: Payer: Self-pay

## 2019-07-13 DIAGNOSIS — L219 Seborrheic dermatitis, unspecified: Secondary | ICD-10-CM | POA: Diagnosis not present

## 2019-07-13 DIAGNOSIS — L820 Inflamed seborrheic keratosis: Secondary | ICD-10-CM | POA: Diagnosis not present

## 2019-07-13 MED ORDER — CLOBETASOL PROPIONATE 0.05 % EX SOLN: CUTANEOUS | 0 refills | Status: DC

## 2019-07-13 NOTE — Progress Notes (Signed)
   New Patient Visit  Subjective  Katherine Montgomery is a 54 y.o. female who presents for the following: New Patient (Initial Visit).  Patient presents today as a new patient for a red area on the dorsal side right hand, has been there for about 10 years, has been the worst in the past year. Has been scaly, red, raised, little itch, and can bleed. Patient also has flaky itchy, scaly scalp that happens mostly when she gets stressed. Patient has a family history of Psoriasis  The following portions of the chart were reviewed this encounter and updated as appropriate:      Review of Systems:  No other skin or systemic complaints except as noted in HPI or Assessment and Plan.  Objective  Well appearing patient in no apparent distress; mood and affect are within normal limits.  A focused examination was performed including hands and scalp. Relevant physical exam findings are noted in the Assessment and Plan.  Objective  Right Dorsal Hand: Erythematous keratotic waxy stuck-on papule   Objective  Right post pareital Scalp: Pink scaly patch with excoriations    Assessment & Plan  Inflamed seborrheic keratosis Right Dorsal Hand  Reassured benign age-related growth.  Recommend observation.  Discussed cryotherapy if inflammation doesn't resolve with topical steroid   Will treat with bryhali lotion once to twice daily until f/u in 3 weeks  Seborrheic dermatitis Right post pareital Scalp  Start Clobetasol solution, apply one to two drops once to twice daily prn rash till rash is resolved Recommend T-Sal shampoo, use daily as rash clears, then once to twice weekly for maintenance.  Can also try H&S clinical strength to alternate.  clobetasol (TEMOVATE) 0.05 % external solution - Right post pareital Scalp  Return in about 3 weeks (around 08/03/2019) for  isk on hand, scalp.  Marene Lenz, CMA, am acting as scribe for Brendolyn Patty, MD .  Documentation: I have reviewed the above  documentation for accuracy and completeness, and I agree with the above.  Brendolyn Patty MD

## 2019-07-13 NOTE — Patient Instructions (Signed)
Recommend daily broad spectrum sunscreen SPF 30+ to sun-exposed areas, reapply every 2 hours as needed. Call for new or changing lesions.  

## 2019-08-04 ENCOUNTER — Other Ambulatory Visit: Payer: Self-pay

## 2019-08-04 ENCOUNTER — Ambulatory Visit: Payer: BC Managed Care – PPO | Admitting: Dermatology

## 2019-08-04 DIAGNOSIS — L820 Inflamed seborrheic keratosis: Secondary | ICD-10-CM | POA: Diagnosis not present

## 2019-08-04 NOTE — Progress Notes (Signed)
   Follow-Up Visit   Subjective  Katherine Montgomery is a 54 y.o. female who presents for the following: recheck ISK (R hand dorsum - patient using Bryhali lotion once daily and has noticed some improvement but lesion is still present. Patient states lesion has been there for about 10 years).  No change in size but it turns red off and on.  It looked better until yesterday and then turned red again.  The following portions of the chart were reviewed this encounter and updated as appropriate:     Review of Systems:  No other skin or systemic complaints except as noted in HPI or Assessment and Plan.  Objective  Well appearing patient in no apparent distress; mood and affect are within normal limits.  A focused examination was performed including the right hand. Relevant physical exam findings are noted in the Assessment and Plan.  Objective  R dorsal hand: 1.0 cm light pink waxy macule  Assessment & Plan  Inflamed seborrheic keratosis R dorsal hand  Cryotherapy today.  D/C Bryhali lotion. Plan biopsy at follow up appointment if not resolved.  Destruction of lesion - R dorsal hand  Destruction method: cryotherapy   Informed consent: discussed and consent obtained   Lesion destroyed using liquid nitrogen: Yes   Region frozen until ice ball extended beyond lesion: Yes   Outcome: patient tolerated procedure well with no complications   Post-procedure details: wound care instructions given    Return in about 2 months (around 10/05/2019), or recheck ISK R hand.  Luther Redo, CMA, am acting as scribe for Brendolyn Patty, MD .  Documentation: I have reviewed the above documentation for accuracy and completeness, and I agree with the above.  Brendolyn Patty MD

## 2019-08-04 NOTE — Patient Instructions (Signed)
Cryotherapy Aftercare  . Wash gently with soap and water everyday.   . Apply Vaseline and Band-Aid daily until healed.  

## 2019-08-25 ENCOUNTER — Ambulatory Visit (INDEPENDENT_AMBULATORY_CARE_PROVIDER_SITE_OTHER): Payer: BC Managed Care – PPO | Admitting: Certified Nurse Midwife

## 2019-08-25 ENCOUNTER — Other Ambulatory Visit: Payer: Self-pay

## 2019-08-25 ENCOUNTER — Encounter: Payer: Self-pay | Admitting: Certified Nurse Midwife

## 2019-08-25 VITALS — BP 110/60 | HR 66 | Ht 70.0 in | Wt 174.0 lb

## 2019-08-25 DIAGNOSIS — N926 Irregular menstruation, unspecified: Secondary | ICD-10-CM

## 2019-08-25 DIAGNOSIS — Z01419 Encounter for gynecological examination (general) (routine) without abnormal findings: Secondary | ICD-10-CM

## 2019-08-25 NOTE — Progress Notes (Signed)
Gynecology Annual Exam  PCP: Lenard Simmer, MD  Chief Complaint:  Chief Complaint  Patient presents with  . Gynecologic Exam    sporatic periods - on a day then off a couple of days then back on    History of Present Illness: Katherine Montgomery is a 54 y.o. G0P0000 who presents for her annual exam. Katherine Montgomery reports that her bleeding on the progesterone only pill has been erratic since February of this year. Last year she was basically amenorrheic after 05/2018. In February this year she had a menses that lasted for 2 weeks. Since then her bleeding has been erratic, but usually at least monthly. She has lite bleeding or spotting over a 4-5 day period. .She denies hot flashes. She had mildly elevated FSH and LH in 2019 (not in menopause range). She denies dysmenorrhea.  Last pap smear: 08/19/2018, results were NIL.  No history of abnormal Pap smears  The patient is sexually active. She currently uses a progesterone only pill  for contraception. She does not have dyspareunia.  Since her last visit, she has had no other significant changes in her health.  Her mother had a stroke at age 12 and is now in a long term care facility. Her father in law died earlier this year.   Her past medical history is remarkable for hypothyroidism, ocular migraines, and hyperlipidemia  The patient does perform occ self breast exams. Her last mammogram was 05/17/2019, results were negative.  There is a family history of breast cancer in her mother Genetic testing has not been done.  There is no family history of ovarian cancer.   She had a colonoscopy done 09/25/2017. There were 2 tubular adenomas removed and her next colonoscopy is in 3 years (2022).  Her PCP Dr Carmela Rima did a DEXA and patient reports it showed low bone density. She also had a low vitamin D and she is currently taking calcium and vitamin D supplements.   The patient denies smoking.  She drinks alcohol occasionally. She drinks 3-4 beer on  the weekends.   She denies illegal drug use.  The patient reports exercising regularly by walking. She has decreased her walking to twice a week, usually walking 4 miles a day on the weekends. .  The patient denies current symptoms of depression.    Review of Systems: Review of Systems  Constitutional: Negative for chills, fever and weight loss.  HENT: Negative for congestion, sinus pain and sore throat.   Eyes: Negative for blurred vision, double vision and pain.  Respiratory: Negative for hemoptysis, shortness of breath and wheezing.   Cardiovascular: Negative for chest pain, palpitations and leg swelling.  Gastrointestinal: Negative for abdominal pain, blood in stool, diarrhea, heartburn, nausea and vomiting.  Genitourinary: Negative for dysuria, frequency, hematuria and urgency.       Positive for irregular menses  Musculoskeletal: Negative for back pain, joint pain and myalgias.  Skin: Negative for itching and rash.  Neurological: Negative for dizziness, tingling and headaches.  Endo/Heme/Allergies: Negative for environmental allergies and polydipsia. Does not bruise/bleed easily.       Negative for hirsutism and hot flashes   Psychiatric/Behavioral: Negative for depression. The patient is not nervous/anxious and does not have insomnia.     Past Medical History:  Past Medical History:  Diagnosis Date  . Family history of breast cancer    mother age 68 - pt's lifetime risk is 27.3%  . Goiter   . Hyperlipidemia   .  Hypothyroid   . Ocular migraine     Past Surgical History:  Past Surgical History:  Procedure Laterality Date  . COLONOSCOPY WITH PROPOFOL N/A 09/25/2017   Procedure: COLONOSCOPY WITH PROPOFOL;  Surgeon: Virgel Manifold, MD;  Location: [facility] ENDOSCOPY;  Service: Endoscopy;  Laterality: N/A;  . THYROIDECTOMY, PARTIAL  2006   left hemithyroidectomy    Family History:  Family History  Problem Relation Age of Onset  . Breast cancer Mother 32  . Stroke  Mother 16  . Brain cancer Father 30  . Colon cancer Maternal Grandfather 80    Social History:  Social History   Socioeconomic History  . Marital status: Married    Spouse name: Not on file  . Number of children: 0  . Years of education: 83  . Highest education level: Not on file  Occupational History  . Occupation: Banker  Tobacco Use  . Smoking status: Never Smoker  . Smokeless tobacco: Never Used  Vaping Use  . Vaping Use: Never used  Substance and Sexual Activity  . Alcohol use: Yes  . Drug use: No  . Sexual activity: Yes    Partners: Male    Birth control/protection: Pill  Other Topics Concern  . Not on file  Social History Narrative  . Not on file   Social Determinants of Health   Financial Resource Strain:   . Difficulty of Paying Living Expenses:   Food Insecurity:   . Worried About Charity fundraiser in the Last Year:   . Arboriculturist in the Last Year:   Transportation Needs:   . Film/video editor (Medical):   Marland Kitchen Lack of Transportation (Non-Medical):   Physical Activity:   . Days of Exercise per Week:   . Minutes of Exercise per Session:   Stress:   . Feeling of Stress :   Social Connections:   . Frequency of Communication with Friends and Family:   . Frequency of Social Gatherings with Friends and Family:   . Attends Religious Services:   . Active Member of Clubs or Organizations:   . Attends Archivist Meetings:   Marland Kitchen Marital Status:   Intimate Partner Violence:   . Fear of Current or Ex-Partner:   . Emotionally Abused:   Marland Kitchen Physically Abused:   . Sexually Abused:     Allergies:  No Known Allergies  Medications: [place] BCPs and  Current Outpatient Medications on File Prior to Visit  Medication Sig Dispense Refill  . calcium-vitamin D (OSCAL WITH D) 500-200 MG-UNIT tablet Take 1 tablet by mouth daily.    . clobetasol (TEMOVATE) 0.05 % external solution Apply 1 to 2 drops once to twice as needed for rash til clears. 50 mL 0    . levothyroxine (SYNTHROID, LEVOTHROID) 112 MCG tablet Take 112 mcg by mouth daily before breakfast.    . NORLYDA 0.35 MG tablet TAKE 1 TABLET(0.35 MG) BY MOUTH DAILY 84 tablet 1  . rosuvastatin (CRESTOR) 40 MG tablet Take 40 mg by mouth daily.    . Vitamin D, Ergocalciferol, (DRISDOL) 1.25 MG (50000 UNIT) CAPS capsule Take 50,000 Units by mouth every 7 (seven) days.     No current facility-administered medications on file prior to visit.   Physical Exam Vitals: BP (!) 110/60   Pulse 66   Ht 5\' 10"  (1.778 m)   Wt 174 lb (78.9 kg)   LMP 08/11/2019 (Approximate)   BMI 24.97 kg/m  General: pleasant WF in NAD HEENT: normocephalic,  anicteric Neck: no thyroid enlargement, no palpable nodules, no cervical lymphadenopathy  Pulmonary: No increased work of breathing, CTAB Cardiovascular: RRR, without murmur  Breast: Breast symmetrical, no tenderness, no palpable nodules or masses, no skin or nipple retraction present, no nipple discharge.  No axillary, infraclavicular or supraclavicular lymphadenopathy. Abdomen: Soft, non-tender, non-distended.  Umbilicus without lesions.  No hepatomegaly or masses palpable. No evidence of hernia. Genitourinary:  External: Normal external female genitalia.  Normal urethral meatus, normal Bartholin's and Skene's glands.    Vagina: Normal vaginal mucosa, no evidence of prolapse, white mucoepithelial discharge.    Cervix: Grossly normal in appearance, no bleeding, non-tender  Uterus: Anteverted, normal size, shape, and consistency, mobile, and non-tender  Adnexa: No adnexal masses, non-tender  Rectal: deferred  Lymphatic: no evidence of inguinal lymphadenopathy Extremities: no edema, erythema, or tenderness Neurologic: Grossly intact Psychiatric: mood appropriate, affect full     Assessment: 54 y.o. G0P0000 well woman exam Perimenopausal bleeding- discussed stopping the minipill and see whether bleeding persist Get [facility] and LH when off pills 1-2  weeks  Keep menstrual calendar  Recommend pelvic ultrasound  If abnormal bleeding persist.  Recommend using contraception until no menses x 1 year.  Plan:   1) Breast cancer screening - recommend monthly self breast exam and annual 3D screening mammograms Mammogram is UTD  2) Cervical cancer screening - Pap was not done. ASCCP guidelines and rational discussed.    3) Contraception - minipill/ condoms  4) Routine healthcare maintenance including cholesterol and diabetes screening managed by PCP   5) Colon cancer screening-colonoscopy up to date. Next due in 2022  6) Discussed the role of exercise in preventing osteoporosis  7) RTO 1 year and prn.  Dalia Heading, CNM

## 2019-10-04 ENCOUNTER — Ambulatory Visit: Payer: BC Managed Care – PPO | Admitting: Dermatology

## 2019-10-12 ENCOUNTER — Ambulatory Visit: Payer: BC Managed Care – PPO | Admitting: Dermatology

## 2019-10-12 ENCOUNTER — Other Ambulatory Visit: Payer: Self-pay

## 2019-10-12 DIAGNOSIS — L237 Allergic contact dermatitis due to plants, except food: Secondary | ICD-10-CM

## 2019-10-12 DIAGNOSIS — L820 Inflamed seborrheic keratosis: Secondary | ICD-10-CM

## 2019-10-12 MED ORDER — MOMETASONE FUROATE 0.1 % EX CREA: TOPICAL_CREAM | CUTANEOUS | 0 refills | Status: DC

## 2019-10-12 NOTE — Progress Notes (Signed)
   Follow-Up Visit   Subjective  Katherine Montgomery is a 54 y.o. female who presents for the following: ISK (2 month follow-up. Improved with LN2 tx, still there a little).  Possible poison ivy on her face and left leg that came up last Wed and Thurs. She was pulling weeds outside last week.   The following portions of the chart were reviewed this encounter and updated as appropriate:      Review of Systems:  No other skin or systemic complaints except as noted in HPI or Assessment and Plan.  Objective  Well appearing patient in no apparent distress; mood and affect are within normal limits.  A focused examination was performed including face, neck, chest and back and hands, leg, face. Relevant physical exam findings are noted in the Assessment and Plan.  Objective  Right Dorsal Hand: Hypopigmented macule with surrounding light pink brown patch.  Mild scale at edge  Images    Objective  Face, legs: Pink edematous papules on left cheek and chin; small pink papules some linear on left medial knee and right thigh.   Assessment & Plan  Inflamed seborrheic keratosis Right Dorsal Hand  Improved with LN2 tx with small residual. Pt had extensive rxn from prior freezing and still has a little numbness, so will defer repeat treatment at this time.  Observe for changes.  Could consider repeat Ln2 or Efudex Cream trial vrs bx if lesion comes back.  Will recheck in 2 months.  Allergic contact dermatitis due to plants, except food Face, legs  Start Mometasone Cream Apply to AAs rash BID until improved. Ok to use on face for short periods of time.  Discussed oral Prednisone if rash spreads closer to eyes.  Topical steroids (such as triamcinolone, fluocinolone, fluocinonide, mometasone, clobetasol, halobetasol, betamethasone, hydrocortisone) can cause thinning and lightening of the skin if they are used for too long in the same area. Your physician has selected the right strength medicine  for your problem and area affected on the body. Please use your medication only as directed by your physician to prevent side effects.      mometasone (ELOCON) 0.1 % cream - Face, legs  Return in about 11 weeks (around 12/28/2019) for ISK R hand recheck.  IJamesetta Orleans, CMA, am acting as scribe for Brendolyn Patty, MD .  Documentation: I have reviewed the above documentation for accuracy and completeness, and I agree with the above.  Brendolyn Patty MD

## 2019-10-25 ENCOUNTER — Ambulatory Visit: Payer: BC Managed Care – PPO | Admitting: Dermatology

## 2019-10-25 ENCOUNTER — Other Ambulatory Visit: Payer: Self-pay

## 2019-10-25 DIAGNOSIS — L905 Scar conditions and fibrosis of skin: Secondary | ICD-10-CM | POA: Diagnosis not present

## 2019-10-25 DIAGNOSIS — L42 Pityriasis rosea: Secondary | ICD-10-CM

## 2019-10-25 NOTE — Progress Notes (Signed)
   Follow-Up Visit   Subjective  Katherine Montgomery is a 54 y.o. female who presents for the following: Rash.  Patient here today for a rash that she noticed at abdomen 2 days ago. They have since spread up her trunk and to upper arms and look like bites. They do not itch. Patient has used some hydrocortisone but it hasn't helped. Patient recently did have poison ivy. No prior URI or other recent viral infection.  She has had pityriasis rosea twice in past and this looks the same.  The following portions of the chart were reviewed this encounter and updated as appropriate:      Review of Systems:  No other skin or systemic complaints except as noted in HPI or Assessment and Plan.  Objective  Well appearing patient in no apparent distress; mood and affect are within normal limits.  A focused examination was performed including face, neck, chest and back and arms, left leg. Relevant physical exam findings are noted in the Assessment and Plan.  Objective  trunk: Light pink macules with fine scale scattered on abdomen, trunk, upper inner arms  Objective  Left Knee: Violaceous smooth patch, h/o recent trauma   Assessment & Plan  Pityriasis rosea trunk  Benign May use mometasone twice daily as needed for itch. Pt has. Patient advised this needs to run it's course which could last at least 8 weeks, cause is unclear, possibly viral. Info given.    Scar Left Knee  Benign, observe.    Return as scheduled, for ISK follow up.  Graciella Belton, RMA, am acting as scribe for Brendolyn Patty, MD . Documentation: I have reviewed the above documentation for accuracy and completeness, and I agree with the above.  Brendolyn Patty MD

## 2019-11-11 ENCOUNTER — Other Ambulatory Visit: Payer: Self-pay

## 2019-11-11 DIAGNOSIS — N926 Irregular menstruation, unspecified: Secondary | ICD-10-CM

## 2019-11-11 MED ORDER — NORETHINDRONE 0.35 MG PO TABS: 1.0000 | ORAL_TABLET | Freq: Every day | ORAL | 2 refills | Status: DC

## 2019-11-11 NOTE — Telephone Encounter (Signed)
Patient reports at her last visit, CLG advised her to stop her pills and come back within a few weeks to determine if she is in/past menopause.

## 2019-11-11 NOTE — Telephone Encounter (Signed)
Spoke w/pt. Advised lab order is in. Apt scheduled for 11/14/19 @8 :40 for lab.

## 2019-11-11 NOTE — Telephone Encounter (Signed)
Fax refill request received. Refill sent

## 2019-11-14 ENCOUNTER — Other Ambulatory Visit: Payer: BC Managed Care – PPO

## 2019-11-14 ENCOUNTER — Other Ambulatory Visit: Payer: Self-pay

## 2019-11-14 DIAGNOSIS — N926 Irregular menstruation, unspecified: Secondary | ICD-10-CM

## 2019-11-15 LAB — FSH/LH
FSH: 96.6 m[IU]/mL
LH: 39.4 m[IU]/mL

## 2019-11-15 NOTE — Progress Notes (Signed)
Let patient know that labs Jaclyn Shaggy has ordered reveal likely menopausal status, and inability to get pregnant.  Let us know if periods continue to be a problem off of the pill.

## 2019-11-15 NOTE — Progress Notes (Signed)
Pt states she has not had a period. She will call us if any issues.

## 2020-01-02 ENCOUNTER — Other Ambulatory Visit: Payer: Self-pay

## 2020-01-02 ENCOUNTER — Ambulatory Visit: Payer: BC Managed Care – PPO | Admitting: Dermatology

## 2020-01-02 DIAGNOSIS — L820 Inflamed seborrheic keratosis: Secondary | ICD-10-CM | POA: Diagnosis not present

## 2020-01-02 DIAGNOSIS — L819 Disorder of pigmentation, unspecified: Secondary | ICD-10-CM

## 2020-01-02 NOTE — Progress Notes (Signed)
   Follow-Up Visit   Subjective  Mackenzey Crownover is a 54 y.o. female who presents for the following: Follow-up (Inflamed SK, improved from LN2 tx 08/04/2019).   The following portions of the chart were reviewed this encounter and updated as appropriate:      Review of Systems:  No other skin or systemic complaints except as noted in HPI or Assessment and Plan.  Objective  Well appearing patient in no apparent distress; mood and affect are within normal limits.  A focused examination was performed including face, hands. Relevant physical exam findings are noted in the Assessment and Plan.  Objective  Right Dorsal Hand: Mild residual pink scaly papule at edge  Objective  Right Hand Dorsum: Hyper and hypopigmented macules at previous site of cryotherapy with lightening and decreased erythema.   Assessment & Plan  Inflamed seborrheic keratosis Right Dorsal Hand  Improved from cryotherapy with small residual. No further treatment at this time since not bothersome.  Will observe.  Post-inflammatory pigmentary changes Right Hand Dorsum  From cryotherapy.  Improving.  Discussed pigmented areas will continue to fade over time, but white spot will likely be permanent.  Recommend broad spectrum SPF 30 sunscreen qd and photoprotection   Return if symptoms worsen or fail to improve.   IJamesetta Orleans, CMA, am acting as scribe for Brendolyn Patty, MD .  Documentation: I have reviewed the above documentation for accuracy and completeness, and I agree with the above.  Brendolyn Patty MD

## 2020-04-19 ENCOUNTER — Other Ambulatory Visit: Payer: Self-pay

## 2020-04-19 DIAGNOSIS — N926 Irregular menstruation, unspecified: Secondary | ICD-10-CM

## 2020-04-19 MED ORDER — NORETHINDRONE 0.35 MG PO TABS: 1.0000 | ORAL_TABLET | Freq: Every day | ORAL | 0 refills | Status: DC

## 2020-04-19 NOTE — Telephone Encounter (Signed)
[facility] faxed over request for refill of Norlyda.  #84 eRx'd to get pt to time for her annual which is in July.

## 2020-05-02 ENCOUNTER — Other Ambulatory Visit: Payer: Self-pay | Admitting: Obstetrics & Gynecology

## 2020-05-02 ENCOUNTER — Telehealth: Payer: Self-pay

## 2020-05-02 DIAGNOSIS — N950 Postmenopausal bleeding: Secondary | ICD-10-CM

## 2020-05-02 DIAGNOSIS — N926 Irregular menstruation, unspecified: Secondary | ICD-10-CM

## 2020-05-02 NOTE — Telephone Encounter (Signed)
Lets schedule a GYN [place] then follow up here to see what may be contributing to your bleeding Ordered

## 2020-05-02 NOTE — Telephone Encounter (Signed)
Pt calling; told last year she is menopausal; no period in over a year; for some reason she just had a period that caught her off guard; what to do?  [phone]

## 2020-05-03 NOTE — Telephone Encounter (Signed)
Called and spoke with patient about scheduling [facility] Patient isn't available that day and cancelled appointment. I gave patient the number to contact Centralized scheduling to schedule for her own schedule. And Advise to reach out to scheduled ultrasound follow up. Patient understood.

## 2020-06-07 ENCOUNTER — Ambulatory Visit: Payer: BC Managed Care – PPO

## 2020-06-18 ENCOUNTER — Other Ambulatory Visit: Payer: Self-pay

## 2020-06-18 ENCOUNTER — Ambulatory Visit
Admission: RE | Admit: 2020-06-18 | Discharge: 2020-06-18 | Disposition: A | Discharge status: Home / Self Care | Payer: BC Managed Care – PPO | Source: Ambulatory Visit | Attending: Obstetrics & Gynecology | Admitting: Obstetrics & Gynecology

## 2020-06-18 DIAGNOSIS — N950 Postmenopausal bleeding: Secondary | ICD-10-CM | POA: Insufficient documentation

## 2020-06-20 ENCOUNTER — Other Ambulatory Visit: Payer: Self-pay

## 2020-06-20 ENCOUNTER — Encounter: Payer: Self-pay | Admitting: Obstetrics and Gynecology

## 2020-06-20 ENCOUNTER — Ambulatory Visit: Payer: BC Managed Care – PPO | Admitting: Obstetrics and Gynecology

## 2020-06-20 VITALS — BP 118/72 | Ht 70.0 in | Wt 173.4 lb

## 2020-06-20 DIAGNOSIS — N950 Postmenopausal bleeding: Secondary | ICD-10-CM

## 2020-06-20 DIAGNOSIS — Z1231 Encounter for screening mammogram for malignant neoplasm of breast: Secondary | ICD-10-CM | POA: Diagnosis not present

## 2020-06-20 NOTE — Progress Notes (Signed)
Patient ID: Katherine Montgomery, female   DOB: 10-09-1965, 55 y.o.   MRN: [id number]  Reason for Consult: Gynecologic Exam   Referred by Lenard Simmer, MD  Subjective:     HPI:  Katherine Montgomery is a 55 y.o. female. She presents today following up from a pelvic US. She has an episode of postmenopausal bleeding in March which was like a normal period. She noted 7 days of moderate to light bleeding.   Gynecological History  No LMP recorded. Menarche: 15 Menopause: 52  History of fibroids, polyps, or ovarian cysts? : endometrial polyp removed in the past  History of PCOS? no Hstory of Endometriosis? no History of abnormal pap smears? no Have you had any sexually transmitted infections in the past? no  Last Pap: Results were: 2020  NIL and HR HPV negative   She identifies as a female. She is sexually active with men.   She denies dyspareunia. She denies postcoital bleeding.  She currently uses none for contraception.   Obstetrical History OB History  Gravida Para Term Preterm AB Living  0 0 0 0 0 0  SAB IAB Ectopic Multiple Live Births  0 0 0 0 0     Past Medical History:  Diagnosis Date  . Family history of breast cancer    mother age 91 - pt's lifetime risk is 27.3%  . Goiter   . Hyperlipidemia   . Hypothyroid   . Ocular migraine    Family History  Problem Relation Age of Onset  . Breast cancer Mother 21  . Stroke Mother 64  . Brain cancer Father 23  . Colon cancer Maternal Grandfather 22   Past Surgical History:  Procedure Laterality Date  . COLONOSCOPY WITH PROPOFOL N/A 09/25/2017   Procedure: COLONOSCOPY WITH PROPOFOL;  Surgeon: Virgel Manifold, MD;  Location: [facility] ENDOSCOPY;  Service: Endoscopy;  Laterality: N/A;  . THYROIDECTOMY, PARTIAL  2006   left hemithyroidectomy    Short Social History:  Social History   Tobacco Use  . Smoking status: Never Smoker  . Smokeless tobacco: Never Used  Substance Use Topics  . Alcohol use: Yes    No Known  Allergies  Current Outpatient Medications  Medication Sig Dispense Refill  . calcium-vitamin D (OSCAL WITH D) 500-200 MG-UNIT tablet Take 1 tablet by mouth daily.    . clobetasol (TEMOVATE) 0.05 % external solution Apply 1 to 2 drops once to twice as needed for rash til clears. 50 mL 0  . levothyroxine (SYNTHROID, LEVOTHROID) 112 MCG tablet Take 112 mcg by mouth daily before breakfast.    . mometasone (ELOCON) 0.1 % cream Apply to affected areas rash twice daily until improved. 45 g 0  . norethindrone (NORLYDA) 0.35 MG tablet Take 1 tablet (0.35 mg total) by mouth daily. 84 tablet 0  . rosuvastatin (CRESTOR) 40 MG tablet Take 40 mg by mouth daily.    . Vitamin D, Ergocalciferol, (DRISDOL) 1.25 MG (50000 UNIT) CAPS capsule Take 50,000 Units by mouth every 7 (seven) days.     No current facility-administered medications for this visit.    Review of Systems  Constitutional: Negative for chills, fatigue, fever and unexpected weight change.  HENT: Negative for trouble swallowing.  Eyes: Negative for loss of vision.  Respiratory: Negative for cough, shortness of breath and wheezing.  Cardiovascular: Negative for chest pain, leg swelling, palpitations and syncope.  GI: Negative for abdominal pain, blood in stool, diarrhea, nausea and vomiting.  GU: Negative for difficulty urinating, dysuria,  frequency and hematuria.  Musculoskeletal: Negative for back pain, leg pain and joint pain.  Skin: Negative for rash.  Neurological: Negative for dizziness, headaches, light-headedness, numbness and seizures.  Psychiatric: Negative for behavioral problem, confusion, depressed mood and sleep disturbance.        Objective:  Objective   Vitals:   06/20/20 0819  BP: 118/72  Weight: 173 lb 6.4 oz (78.7 kg)  Height: 5\' 10"  (1.778 m)   Body mass index is 24.88 kg/m.  Physical Exam Vitals and nursing note reviewed. Exam conducted with a chaperone present.  Constitutional:      Appearance: Normal  appearance.  HENT:     Head: Normocephalic and atraumatic.  Eyes:     Extraocular Movements: Extraocular movements intact.     Pupils: Pupils are equal, round, and reactive to light.  Cardiovascular:     Rate and Rhythm: Normal rate and regular rhythm.  Pulmonary:     Effort: Pulmonary effort is normal.     Breath sounds: Normal breath sounds.  Abdominal:     General: Abdomen is flat.     Palpations: Abdomen is soft.  Genitourinary:    Comments: Declines pelvic exam and breast exam today Musculoskeletal:     Cervical back: Normal range of motion.  Skin:    General: Skin is warm and dry.     Findings: No erythema.  Neurological:     General: No focal deficit present.     Mental Status: She is alert and oriented to person, place, and time.  Psychiatric:        Behavior: Behavior normal.        Thought Content: Thought content normal.        Judgment: Judgment normal.     Assessment/Plan:     55 yo with episode of postmenopausal bleeding Thin atrophic endometrium on pelvic US.  Discussed that since the endometrial thickness is less than 4 mm this is considered reassuring with less than 99% liklihood of hyperplasia or malignancy. Patient is to return if bleeding occurs because all episodes and events of postmenopausal bleeding should be evaluated.  Mammogram ordered  More than 15 minutes were spent face to face with the patient in the room, reviewing the medical record, labs and images, and coordinating care for the patient. The plan of management was discussed in detail and counseling was provided.    Adrian Prows MD [facility] 06/20/2020 8:27 AM

## 2020-07-10 ENCOUNTER — Ambulatory Visit
Admission: RE | Admit: 2020-07-10 | Discharge: 2020-07-10 | Disposition: A | Discharge status: Home / Self Care | Payer: BC Managed Care – PPO | Source: Ambulatory Visit | Attending: Obstetrics and Gynecology | Admitting: Obstetrics and Gynecology

## 2020-07-10 ENCOUNTER — Other Ambulatory Visit: Payer: Self-pay

## 2020-07-10 DIAGNOSIS — Z1231 Encounter for screening mammogram for malignant neoplasm of breast: Secondary | ICD-10-CM | POA: Insufficient documentation

## 2020-08-16 ENCOUNTER — Telehealth: Payer: Self-pay

## 2020-08-16 NOTE — Telephone Encounter (Signed)
Pt calling; is still having periods; thought she had stopped but they started back up again on a regular schedule; wants to go on bcp even though thinks she took a test that said she couldn't get preg.  [phone]

## 2020-08-17 NOTE — Telephone Encounter (Signed)
Schuman pt. Needs f/u appt with her per her last note

## 2020-08-20 NOTE — Telephone Encounter (Signed)
Patient is scheduled for 08/22/20

## 2020-08-22 ENCOUNTER — Other Ambulatory Visit: Payer: Self-pay

## 2020-08-22 ENCOUNTER — Ambulatory Visit: Payer: BC Managed Care – PPO | Admitting: Obstetrics and Gynecology

## 2020-08-22 ENCOUNTER — Encounter: Payer: Self-pay | Admitting: Obstetrics and Gynecology

## 2020-08-22 VITALS — BP 128/70 | Ht 70.0 in | Wt 170.6 lb

## 2020-08-22 DIAGNOSIS — Z780 Asymptomatic menopausal state: Secondary | ICD-10-CM

## 2020-08-22 NOTE — Progress Notes (Signed)
Patient ID: Katherine Montgomery, female   DOB: 1965-11-27, 56 y.o.   MRN: [id number]  Reason for Consult: Gynecologic Exam   Referred by Lenard Simmer, MD  Subjective:     HPI:  Katherine Montgomery is a 55 y.o. female. She reports that over the last several months she has been having a monthly period. She feels like she has not yet entered menopause and wonders if her period has returned. She has crampy lower abdominal pain before her menses which is similar to symptoms she had before menopause.  Gynecological History  Patient's last menstrual period was 08/16/2020. M Past Medical History:  Diagnosis Date   Family history of breast cancer    mother age 29 - pt's lifetime risk is 27.3%   Goiter    Hyperlipidemia    Hypothyroid    Ocular migraine    Family History  Problem Relation Age of Onset   Breast cancer Mother 50   Stroke Mother 43   Brain cancer Father 28   Colon cancer Maternal Grandfather 69   Past Surgical History:  Procedure Laterality Date   COLONOSCOPY WITH PROPOFOL N/A 09/25/2017   Procedure: COLONOSCOPY WITH PROPOFOL;  Surgeon: Virgel Manifold, MD;  Location: [facility] ENDOSCOPY;  Service: Endoscopy;  Laterality: N/A;   THYROIDECTOMY, PARTIAL  2006   left hemithyroidectomy    Short Social History:  Social History   Tobacco Use   Smoking status: Never   Smokeless tobacco: Never  Substance Use Topics   Alcohol use: Yes    No Known Allergies  Current Outpatient Medications  Medication Sig Dispense Refill   calcium-vitamin D (OSCAL WITH D) 500-200 MG-UNIT tablet Take 1 tablet by mouth daily.     clobetasol (TEMOVATE) 0.05 % external solution Apply 1 to 2 drops once to twice as needed for rash til clears. 50 mL 0   levothyroxine (SYNTHROID, LEVOTHROID) 112 MCG tablet Take 112 mcg by mouth daily before breakfast.     mometasone (ELOCON) 0.1 % cream Apply to affected areas rash twice daily until improved. 45 g 0   norethindrone (NORLYDA) 0.35 MG tablet Take 1  tablet (0.35 mg total) by mouth daily. 84 tablet 0   rosuvastatin (CRESTOR) 40 MG tablet Take 40 mg by mouth daily.     Vitamin D, Ergocalciferol, (DRISDOL) 1.25 MG (50000 UNIT) CAPS capsule Take 50,000 Units by mouth every 7 (seven) days.     No current facility-administered medications for this visit.    Review of Systems  Constitutional: Negative for chills, fatigue, fever and unexpected weight change.  HENT: Negative for trouble swallowing.  Eyes: Negative for loss of vision.  Respiratory: Negative for cough, shortness of breath and wheezing.  Cardiovascular: Negative for chest pain, leg swelling, palpitations and syncope.  GI: Negative for abdominal pain, blood in stool, diarrhea, nausea and vomiting.  GU: Negative for difficulty urinating, dysuria, frequency and hematuria.  Musculoskeletal: Negative for back pain, leg pain and joint pain.  Skin: Negative for rash.  Neurological: Negative for dizziness, headaches, light-headedness, numbness and seizures.  Psychiatric: Negative for behavioral problem, confusion, depressed mood and sleep disturbance.       Objective:  Objective   Vitals:   08/22/20 1038  BP: 128/70  Weight: 170 lb 9.6 oz (77.4 kg)  Height: '5\' 10"'$  (1.778 m)   Body mass index is 24.48 kg/m.  Physical Exam Vitals and nursing note reviewed. Exam conducted with a chaperone present.  Constitutional:      Appearance: Normal appearance.  She is well-developed.  HENT:     Head: Normocephalic and atraumatic.  Eyes:     Extraocular Movements: Extraocular movements intact.     Pupils: Pupils are equal, round, and reactive to light.  Cardiovascular:     Rate and Rhythm: Normal rate and regular rhythm.  Pulmonary:     Effort: Pulmonary effort is normal. No respiratory distress.     Breath sounds: Normal breath sounds.  Abdominal:     General: Abdomen is flat.     Palpations: Abdomen is soft.  Musculoskeletal:        General: No signs of injury.     Cervical  back: Normal range of motion.  Skin:    General: Skin is warm and dry.  Neurological:     General: No focal deficit present.     Mental Status: She is alert and oriented to person, place, and time.  Psychiatric:        Behavior: Behavior normal.        Thought Content: Thought content normal.        Judgment: Judgment normal.    Assessment/Plan:     55 yo with abnormal uterine bleeding Discussed possibility of continued postmenopausal bleeding which would necessitate sampling by endometrial biopsy or D&C or hormonal control with Provera to assess for improvement Patient has concerns that she is not yet menopausal in for this reason we will check FSH and estradiol levels today.  We will follow-up with the patient over the phone with planning for neck steps once these labs have resulted.  More than 20 minutes were spent face to face with the patient in the room, reviewing the medical record, labs and images, and coordinating care for the patient. The plan of management was discussed in detail and counseling was provided.    Adrian Prows MD [facility], [facility] 08/22/2020 10:55 AM

## 2020-08-22 NOTE — H&P (View-Only) (Signed)
Patient ID: Katherine Montgomery, female   DOB: April 16, 1965, 55 y.o.   MRN: [id number]  Reason for Consult: Gynecologic Exam   Referred by Lenard Simmer, MD  Subjective:     HPI:  Katherine Montgomery is a 55 y.o. female. She reports that over the last several months she has been having a monthly period. She feels like she has not yet entered menopause and wonders if her period has returned. She has crampy lower abdominal pain before her menses which is similar to symptoms she had before menopause.  Gynecological History  Patient's last menstrual period was 08/16/2020. M Past Medical History:  Diagnosis Date   Family history of breast cancer    mother age 23 - pt's lifetime risk is 27.3%   Goiter    Hyperlipidemia    Hypothyroid    Ocular migraine    Family History  Problem Relation Age of Onset   Breast cancer Mother 20   Stroke Mother 54   Brain cancer Father 84   Colon cancer Maternal Grandfather 74   Past Surgical History:  Procedure Laterality Date   COLONOSCOPY WITH PROPOFOL N/A 09/25/2017   Procedure: COLONOSCOPY WITH PROPOFOL;  Surgeon: Virgel Manifold, MD;  Location: [facility] ENDOSCOPY;  Service: Endoscopy;  Laterality: N/A;   THYROIDECTOMY, PARTIAL  2006   left hemithyroidectomy    Short Social History:  Social History   Tobacco Use   Smoking status: Never   Smokeless tobacco: Never  Substance Use Topics   Alcohol use: Yes    No Known Allergies  Current Outpatient Medications  Medication Sig Dispense Refill   calcium-vitamin D (OSCAL WITH D) 500-200 MG-UNIT tablet Take 1 tablet by mouth daily.     clobetasol (TEMOVATE) 0.05 % external solution Apply 1 to 2 drops once to twice as needed for rash til clears. 50 mL 0   levothyroxine (SYNTHROID, LEVOTHROID) 112 MCG tablet Take 112 mcg by mouth daily before breakfast.     mometasone (ELOCON) 0.1 % cream Apply to affected areas rash twice daily until improved. 45 g 0   norethindrone (NORLYDA) 0.35 MG tablet Take 1  tablet (0.35 mg total) by mouth daily. 84 tablet 0   rosuvastatin (CRESTOR) 40 MG tablet Take 40 mg by mouth daily.     Vitamin D, Ergocalciferol, (DRISDOL) 1.25 MG (50000 UNIT) CAPS capsule Take 50,000 Units by mouth every 7 (seven) days.     No current facility-administered medications for this visit.    Review of Systems  Constitutional: Negative for chills, fatigue, fever and unexpected weight change.  HENT: Negative for trouble swallowing.  Eyes: Negative for loss of vision.  Respiratory: Negative for cough, shortness of breath and wheezing.  Cardiovascular: Negative for chest pain, leg swelling, palpitations and syncope.  GI: Negative for abdominal pain, blood in stool, diarrhea, nausea and vomiting.  GU: Negative for difficulty urinating, dysuria, frequency and hematuria.  Musculoskeletal: Negative for back pain, leg pain and joint pain.  Skin: Negative for rash.  Neurological: Negative for dizziness, headaches, light-headedness, numbness and seizures.  Psychiatric: Negative for behavioral problem, confusion, depressed mood and sleep disturbance.       Objective:  Objective   Vitals:   08/22/20 1038  BP: 128/70  Weight: 170 lb 9.6 oz (77.4 kg)  Height: '5\' 10"'$  (1.778 m)   Body mass index is 24.48 kg/m.  Physical Exam Vitals and nursing note reviewed. Exam conducted with a chaperone present.  Constitutional:      Appearance: Normal appearance.  She is well-developed.  HENT:     Head: Normocephalic and atraumatic.  Eyes:     Extraocular Movements: Extraocular movements intact.     Pupils: Pupils are equal, round, and reactive to light.  Cardiovascular:     Rate and Rhythm: Normal rate and regular rhythm.  Pulmonary:     Effort: Pulmonary effort is normal. No respiratory distress.     Breath sounds: Normal breath sounds.  Abdominal:     General: Abdomen is flat.     Palpations: Abdomen is soft.  Musculoskeletal:        General: No signs of injury.     Cervical  back: Normal range of motion.  Skin:    General: Skin is warm and dry.  Neurological:     General: No focal deficit present.     Mental Status: She is alert and oriented to person, place, and time.  Psychiatric:        Behavior: Behavior normal.        Thought Content: Thought content normal.        Judgment: Judgment normal.    Assessment/Plan:     55 yo with abnormal uterine bleeding Discussed possibility of continued postmenopausal bleeding which would necessitate sampling by endometrial biopsy or D&C or hormonal control with Provera to assess for improvement Patient has concerns that she is not yet menopausal in for this reason we will check FSH and estradiol levels today.  We will follow-up with the patient over the phone with planning for neck steps once these labs have resulted.  More than 20 minutes were spent face to face with the patient in the room, reviewing the medical record, labs and images, and coordinating care for the patient. The plan of management was discussed in detail and counseling was provided.    Adrian Prows MD [facility] 08/22/2020 10:55 AM

## 2020-08-23 LAB — FOLLICLE STIMULATING HORMONE: FSH: 66.3 m[IU]/mL

## 2020-08-23 LAB — ESTRADIOL: Estradiol: 25.8 pg/mL

## 2020-08-23 NOTE — Progress Notes (Signed)
Surgery Booking Request Patient Full Name:  Irania Naeve  MRN: [id number]  DOB: [birth year]  Surgeon: Homero Fellers, MD  Requested Surgery Date and Time: August 2022 Primary Diagnosis AND Code: Postmenopausal bleeding Secondary Diagnosis and Code:  Surgical Procedure: Hysteroscopy D&C RNFA Requested?: No L&D Notification: No Admission Status: same day surgery Length of Surgery: 50 min Special Case Needs: No H&P: No Phone Interview???:  Yes Interpreter: No Medical Clearance:  No Special Scheduling Instructions: No Any known health/anesthesia issues, diabetes, sleep apnea, latex allergy, defibrillator/pacemaker?: No Acuity: P2   (P1 highest, P2 delay may cause harm, P3 low, elective gyn, P4 lowest) Post op follow up visits: 1 week

## 2020-08-30 ENCOUNTER — Telehealth: Payer: Self-pay

## 2020-08-30 NOTE — Telephone Encounter (Signed)
-----   Message from Homero Fellers, MD sent at 08/23/2020  2:00 PM EDT ----- Surgery Booking Request Patient Full Name:  Katherine Montgomery  MRN: [id number]  DOB: January 03, 1966  Surgeon: Homero Fellers, MD  Requested Surgery Date and Time: August 2022 Primary Diagnosis AND Code: Postmenopausal bleeding Secondary Diagnosis and Code:  Surgical Procedure: Hysteroscopy D&C RNFA Requested?: No L&D Notification: No Admission Status: same day surgery Length of Surgery: 50 min Special Case Needs: No H&P: No Phone Interview???:  Yes Interpreter: No Medical Clearance:  No Special Scheduling Instructions: No Any known health/anesthesia issues, diabetes, sleep apnea, latex allergy, defibrillator/pacemaker?: No Acuity: P2   (P1 highest, P2 delay may cause harm, P3 low, elective gyn, P4 lowest) Post op follow up visits: 1 week

## 2020-08-30 NOTE — Telephone Encounter (Signed)
Called pt to discuss dates for procedure w [facility]. She asked about Monday or Friday as her husband is a police office out of town and has those days off or are easier to get the days off. I offered 8/18 and 8/30. She is checking with him and will call me back.

## 2020-09-03 ENCOUNTER — Ambulatory Visit: Payer: BC Managed Care – PPO | Admitting: Dermatology

## 2020-09-03 ENCOUNTER — Other Ambulatory Visit: Payer: Self-pay

## 2020-09-03 DIAGNOSIS — D492 Neoplasm of unspecified behavior of bone, soft tissue, and skin: Secondary | ICD-10-CM

## 2020-09-03 DIAGNOSIS — D351 Benign neoplasm of parathyroid gland: Secondary | ICD-10-CM | POA: Diagnosis not present

## 2020-09-03 DIAGNOSIS — B353 Tinea pedis: Secondary | ICD-10-CM

## 2020-09-03 DIAGNOSIS — D485 Neoplasm of uncertain behavior of skin: Secondary | ICD-10-CM

## 2020-09-03 DIAGNOSIS — L219 Seborrheic dermatitis, unspecified: Secondary | ICD-10-CM | POA: Diagnosis not present

## 2020-09-03 MED ORDER — KETOCONAZOLE 2 % EX SHAM: 1.0000 "application " | MEDICATED_SHAMPOO | CUTANEOUS | 6 refills | Status: AC

## 2020-09-03 MED ORDER — TERBINAFINE HCL 250 MG PO TABS: 250.0000 mg | ORAL_TABLET | Freq: Every day | ORAL | 0 refills | Status: DC

## 2020-09-03 MED ORDER — CLOBETASOL PROPIONATE 0.05 % EX SOLN: 1.0000 "application " | CUTANEOUS | 2 refills | Status: DC

## 2020-09-03 MED ORDER — KETOCONAZOLE 2 % EX CREA: 1.0000 "application " | TOPICAL_CREAM | Freq: Every day | CUTANEOUS | 3 refills | Status: AC

## 2020-09-03 NOTE — Progress Notes (Signed)
Follow-Up Visit   Subjective  Katherine Montgomery is a 55 y.o. female who presents for the following: Rash (L great toe, ~30m no symptoms, gets darker), Nevus (L foot, has had mole yrs, but has changed, gotten darker and larger), and Seborrheic Dermatitis (Scalp, pt needs refill of Clobetasol sol).  The following portions of the chart were reviewed this encounter and updated as appropriate:   Tobacco  Allergies  Meds  Problems  Med Hx  Surg Hx  Fam Hx     Review of Systems:  No other skin or systemic complaints except as noted in HPI or Assessment and Plan.  Objective  Well appearing patient in no apparent distress; mood and affect are within normal limits.  A focused examination was performed including scalp, left foot. Relevant physical exam findings are noted in the Assessment and Plan.  Scalp Scaling of scalp  L lat dorsum foot Irregular brown macule 0.7cm     Bil feet, toenails Toenail dystrophy, erythema and scale bil feet   Assessment & Plan  Seborrheic dermatitis /SEBO psoriasis Scalp Seborrheic Dermatitis  -  is a chronic persistent rash characterized by pinkness and scaling most commonly of the mid face but also can occur on the scalp (dandruff), ears; mid chest and mid back. It tends to be exacerbated by stress and cooler weather.  People who have neurologic disease may experience new onset or exacerbation of existing seborrheic dermatitis.  The condition is not curable but treatable and can be controlled.  Start Ketoconazole 2% shampoo 2x/wk, let sit 5 minutes and rinse out Cont Clobetasol sol qd up to 3d/wk  aa scalp, avoid f/g/a  Topical steroids (such as triamcinolone, fluocinolone, fluocinonide, mometasone, clobetasol, halobetasol, betamethasone, hydrocortisone) can cause thinning and lightening of the skin if they are used for too long in the same area. Your physician has selected the right strength medicine for your problem and area affected on the body.  Please use your medication only as directed by your physician to prevent side effects.    ketoconazole (NIZORAL) 2 % shampoo - Scalp Apply 1 application topically 2 (two) times a week. Let sit 5 minutes and rinse out  clobetasol (TEMOVATE) 0.05 % external solution - Scalp Apply 1 application topically as directed. Apply to aa scalp 3 days weekly, avoid face, groin, axilla  Neoplasm of skin L lat dorsum foot  Epidermal / dermal shaving  Lesion diameter (cm):  0.7 Informed consent: discussed and consent obtained   Timeout: patient name, date of birth, surgical site, and procedure verified   Procedure prep:  Patient was prepped and draped in usual sterile fashion Prep type:  Isopropyl alcohol Anesthesia: the lesion was anesthetized in a standard fashion   Anesthetic:  1% lidocaine w/ epinephrine 1-100,000 buffered w/ 8.4% NaHCO3 Instrument used: flexible razor blade   Hemostasis achieved with: pressure, aluminum chloride and electrodesiccation   Outcome: patient tolerated procedure well   Post-procedure details: sterile dressing applied and wound care instructions given   Dressing type: bandage and petrolatum    Specimen 1 - Surgical pathology Differential Diagnosis: D48.5 Nevus vs Dysplastic nevus  Check Margins: yes Irregular brown macule 0.7cm  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 The pill version is often used to treat fungal infections of the nails or scalp. While most people do not have any side effects from taking terbinafine pills, some possible side effects of the medicine can include taste changes, headache, loss of smell, vision changes, nausea, vomiting, or  diarrhea.   Rare side effects can include irritation of the liver, allergic reaction, or decrease in blood counts (which may show up as not feeling well or developing an infection). If you are concerned about any of these side effects, please stop the medicine and call your doctor, or in the case of an emergency such as feeling very unwell, seek immediate medical care.    terbinafine (LAMISIL) 250 MG tablet - Bil feet, toenails Take 1 tablet (250 mg total) by mouth daily.  ketoconazole (NIZORAL) 2 % cream - Bil feet, toenails Apply 1 application topically daily. Qhs to feet, in between toes and on sides of feet  Return in about 4 weeks (around 10/01/2020) for tinea f/u.  I, Othelia Pulling, RMA, am acting as scribe for Sarina Ser, MD . Documentation: I have reviewed the above documentation for accuracy and completeness, and I agree with the above.  Sarina Ser, MD

## 2020-09-03 NOTE — Patient Instructions (Addendum)
If you have any questions or concerns for your doctor, please call our main line at [phone] and press option 4 to reach your doctor's medical assistant. If no one answers, please leave a voicemail as directed and we will return your call as soon as possible. Messages left after 4 pm will be answered the following business day.   You may also send us a message via MyChart. We typically respond to MyChart messages within 1-2 business days.  For prescription refills, please ask your pharmacy to contact our office. Our fax number is [phone].  If you have an urgent issue when the clinic is closed that cannot wait until the next business day, you can page your doctor at the number below.    Please note that while we do our best to be available for urgent issues outside of office hours, we are not available 24/7.   If you have an urgent issue and are unable to reach us, you may choose to seek medical care at your doctor's office, retail clinic, urgent care center, or emergency room.  If you have a medical emergency, please immediately call 911 or go to the emergency department.  Pager Numbers  - Dr. Kowalski: [phone]  - Dr. Moye: [phone]  - Dr. Stewart: [phone]  In the event of inclement weather, please call our main line at [phone] for an update on the status of any delays or closures.  Dermatology Medication Tips: Please keep the boxes that topical medications come in in order to help keep track of the instructions about where and how to use these. Pharmacies typically print the medication instructions only on the boxes and not directly on the medication tubes.   If your medication is too expensive, please contact our office at [phone] option 4 or send us a message through MyChart.   We are unable to tell what your co-pay for medications will be in advance as this is different depending on your insurance coverage. However, we may be able to find a substitute  medication at lower cost or fill out paperwork to get insurance to cover a needed medication.   If a prior authorization is required to get your medication covered by your insurance company, please allow us 1-2 business days to complete this process.  Drug prices often vary depending on where the prescription is filled and some pharmacies may offer cheaper prices.  The website www.goodrx.com contains coupons for medications through different pharmacies. The prices here do not account for what the cost may be with help from insurance (it may be cheaper with your insurance), but the website can give you the price if you did not use any insurance.  - You can print the associated coupon and take it with your prescription to the pharmacy.  - You may also stop by our office during regular business hours and pick up a GoodRx coupon card.  - If you need your prescription sent electronically to a different pharmacy, notify our office through [place] MyChart or by phone at [phone] option 4.   Wound Care Instructions  Cleanse wound gently with soap and water once a day then pat dry with clean gauze. Apply a thing coat of Petrolatum (petroleum jelly, "Vaseline") over the wound (unless you have an allergy to this). We recommend that you use a new, sterile tube of Vaseline. Do not pick or remove scabs. Do not remove the yellow or white "healing tissue" from the base of the wound.  Cover the   wound with fresh, clean, nonstick gauze and secure with paper tape. You may use Band-Aids in place of gauze and tape if the would is small enough, but would recommend trimming much of the tape off as there is often too much. Sometimes Band-Aids can irritate the skin.  You should call the office for your biopsy report after 1 week if you have not already been contacted.  If you experience any problems, such as abnormal amounts of bleeding, swelling, significant bruising, significant pain, or evidence of infection,  please call the office immediately.  FOR ADULT SURGERY PATIENTS: If you need something for pain relief you may take 1 extra strength Tylenol (acetaminophen) AND 2 Ibuprofen (200mg each) together every 4 hours as needed for pain. (do not take these if you are allergic to them or if you have a reason you should not take them.) Typically, you may only need pain medication for 1 to 3 days.    

## 2020-09-04 ENCOUNTER — Encounter: Payer: Self-pay | Admitting: Dermatology

## 2020-09-05 ENCOUNTER — Other Ambulatory Visit: Payer: Self-pay

## 2020-09-05 ENCOUNTER — Encounter
Admission: RE | Admit: 2020-09-05 | Discharge: 2020-09-05 | Disposition: A | Discharge status: Home / Self Care | Payer: BC Managed Care – PPO | Source: Ambulatory Visit | Attending: Obstetrics and Gynecology | Admitting: Obstetrics and Gynecology

## 2020-09-05 NOTE — Patient Instructions (Signed)
Your procedure is scheduled on: 09/13/20 Report to [place]. To find out your arrival time please call [phone] between 1PM - 3PM on 09/12/20.  Remember: Instructions that are not followed completely may result in serious medical risk, up to and including death, or upon the discretion of your surgeon and anesthesiologist your surgery may need to be rescheduled.     _X__ 1. Do not eat food after midnight the night before your procedure.                 No gum chewing or hard candies. You may drink clear liquids up to 2 hours                 before you are scheduled to arrive for your surgery- DO not drink clear                 liquids within 2 hours of the start of your surgery.                 Clear Liquids include:  water, apple juice without pulp, clear carbohydrate                 drink such as Clearfast or Gatorade, Black Coffee or Tea (Do not add                 anything to coffee or tea). Diabetics water only  YOU HAVE BEEN GIVEN AN ENSURE "CLEAR" PRE SURGERY DRINK. THIS SHOULD BE FINISHE 2 HOURS BEFORE ARRIVING.  __X__2.  On the morning of surgery brush your teeth with toothpaste and water, you                 may rinse your mouth with mouthwash if you wish.  Do not swallow any              toothpaste of mouthwash.     _X__ 3.  No Alcohol for 24 hours before or after surgery.   _X__ 4.  Do Not Smoke or use e-cigarettes For 24 Hours Prior to Your Surgery.                 Do not use any chewable tobacco products for at least 6 hours prior to                 surgery.  ____  5.  Bring all medications with you on the day of surgery if instructed.   __X__  6.  Notify your doctor if there is any change in your medical condition      (cold, fever, infections).     Do not wear jewelry, make-up, hairpins, clips or nail polish. Do not wear lotions, powders, or perfumes.  Do not shave BODY HAIR 48 hours prior to surgery. Men may  shave face and neck. Do not bring valuables to the hospital.    [facility] is not responsible for any belongings or valuables.  Contacts, dentures/partials or body piercings may not be worn into surgery. Bring a case for your contacts, glasses or hearing aids, a denture cup will be supplied. Leave your suitcase in the car. After surgery it may be brought to your room. For patients admitted to the hospital, discharge time is determined by your treatment team.   Patients discharged the day of surgery will not be allowed to drive home.   Please read over the following fact sheets that you were  given:   ENSURE DRINK  __X__ Take these medicines the morning of surgery with A SIP OF WATER:    1. levothyroxine (SYNTHROID, LEVOTHROID) 112 MCG tablet  2.   3.   4.  5.  6.  ____ Fleet Enema (as directed)   ____ Use CHG Soap/SAGE wipes as directed  ____ Use inhalers on the day of surgery  ____ Stop metformin/Janumet/Farxiga 2 days prior to surgery    ____ Take 1/2 of usual insulin dose the night before surgery. No insulin the morning          of surgery.   ____ Stop Blood Thinners Coumadin/Plavix/Xarelto/Pleta/Pradaxa/Eliquis/Effient/Aspirin  on   Or contact your Surgeon, Cardiologist or Medical Doctor regarding  ability to stop your blood thinners  __X__ Stop Anti-inflammatories 7 days before surgery such as Advil, Ibuprofen, Motrin,  BC or Goodies Powder, Naprosyn, Naproxen, Aleve, Aspirin    __X__ Stop all herbal supplements, fish oil or vitamin E until after surgery.    ____ Bring C-Pap to the hospital.

## 2020-09-06 ENCOUNTER — Encounter: Payer: Self-pay | Admitting: Urgent Care

## 2020-09-06 ENCOUNTER — Other Ambulatory Visit
Admission: RE | Admit: 2020-09-06 | Discharge: 2020-09-06 | Disposition: A | Discharge status: Home / Self Care | Payer: BC Managed Care – PPO | Source: Ambulatory Visit | Attending: Obstetrics and Gynecology | Admitting: Obstetrics and Gynecology

## 2020-09-06 DIAGNOSIS — Z01812 Encounter for preprocedural laboratory examination: Secondary | ICD-10-CM | POA: Diagnosis not present

## 2020-09-06 LAB — CBC
HCT: 42.1 % (ref 36.0–46.0)
Hemoglobin: 14.1 g/dL (ref 12.0–15.0)
MCH: 30.8 pg (ref 26.0–34.0)
MCHC: 33.5 g/dL (ref 30.0–36.0)
MCV: 91.9 fL (ref 80.0–100.0)
Platelets: 272 10*3/uL (ref 150–400)
RBC: 4.58 MIL/uL (ref 3.87–5.11)
RDW: 12.1 % (ref 11.5–15.5)
WBC: 3.9 10*3/uL — ABNORMAL LOW (ref 4.0–10.5)
nRBC: 0 % (ref 0.0–0.2)

## 2020-09-06 LAB — TYPE AND SCREEN
ABO/RH(D): A POS
Antibody Screen: NEGATIVE

## 2020-09-13 ENCOUNTER — Ambulatory Visit: Payer: BC Managed Care – PPO | Admitting: Anesthesiology

## 2020-09-13 ENCOUNTER — Ambulatory Visit
Admission: RE | Admit: 2020-09-13 | Discharge: 2020-09-13 | Disposition: A | Discharge status: Home / Self Care | Payer: BC Managed Care – PPO | Attending: Obstetrics and Gynecology | Admitting: Obstetrics and Gynecology

## 2020-09-13 ENCOUNTER — Encounter
Admission: RE | Disposition: A | Discharge status: Home / Self Care | Payer: Self-pay | Source: Home / Self Care | Attending: Obstetrics and Gynecology

## 2020-09-13 ENCOUNTER — Other Ambulatory Visit: Payer: Self-pay

## 2020-09-13 ENCOUNTER — Encounter: Payer: Self-pay | Admitting: Obstetrics and Gynecology

## 2020-09-13 DIAGNOSIS — N950 Postmenopausal bleeding: Secondary | ICD-10-CM

## 2020-09-13 DIAGNOSIS — Z79890 Hormone replacement therapy (postmenopausal): Secondary | ICD-10-CM | POA: Insufficient documentation

## 2020-09-13 DIAGNOSIS — Z79899 Other long term (current) drug therapy: Secondary | ICD-10-CM | POA: Diagnosis not present

## 2020-09-13 HISTORY — PX: HYSTEROSCOPY WITH D & C: SHX1775

## 2020-09-13 LAB — POCT PREGNANCY, URINE: Preg Test, Ur: NEGATIVE

## 2020-09-13 LAB — ABO/RH: ABO/RH(D): A POS

## 2020-09-13 SURGERY — DILATATION AND CURETTAGE /HYSTEROSCOPY
Anesthesia: General

## 2020-09-13 MED ORDER — CHLORHEXIDINE GLUCONATE 0.12 % MT SOLN: 15.0000 mL | Freq: Once | OROMUCOSAL | Status: AC

## 2020-09-13 MED ORDER — KETOROLAC TROMETHAMINE 30 MG/ML IJ SOLN
INTRAMUSCULAR | Status: DC | PRN
  Administered 2020-09-13: 30 mg via INTRAVENOUS

## 2020-09-13 MED ORDER — 0.9 % SODIUM CHLORIDE (POUR BTL) OPTIME
TOPICAL | Status: DC | PRN
  Administered 2020-09-13: 50 mL

## 2020-09-13 MED ORDER — FENTANYL CITRATE (PF) 100 MCG/2ML IJ SOLN: 25.0000 ug | INTRAMUSCULAR | Status: DC | PRN

## 2020-09-13 MED ORDER — FAMOTIDINE 20 MG PO TABS: 20.0000 mg | ORAL_TABLET | Freq: Once | ORAL | Status: AC

## 2020-09-13 MED ORDER — ONDANSETRON HCL 4 MG/2ML IJ SOLN
INTRAMUSCULAR | Status: AC
  Filled 2020-09-13: qty 2

## 2020-09-13 MED ORDER — FAMOTIDINE 20 MG PO TABS
ORAL_TABLET | ORAL | Status: AC
  Administered 2020-09-13: 20 mg via ORAL
  Filled 2020-09-13: qty 1

## 2020-09-13 MED ORDER — ONDANSETRON HCL 4 MG/2ML IJ SOLN
INTRAMUSCULAR | Status: DC | PRN
  Administered 2020-09-13: 4 mg via INTRAVENOUS

## 2020-09-13 MED ORDER — EPHEDRINE SULFATE 50 MG/ML IJ SOLN
INTRAMUSCULAR | Status: DC | PRN
  Administered 2020-09-13: 10 mg via INTRAVENOUS

## 2020-09-13 MED ORDER — PROPOFOL 10 MG/ML IV BOLUS
INTRAVENOUS | Status: DC | PRN
  Administered 2020-09-13: 160 mg via INTRAVENOUS

## 2020-09-13 MED ORDER — ACETAMINOPHEN 10 MG/ML IV SOLN: 1000.0000 mg | Freq: Once | INTRAVENOUS | Status: DC | PRN

## 2020-09-13 MED ORDER — ONDANSETRON HCL 4 MG/2ML IJ SOLN: 4.0000 mg | Freq: Once | INTRAMUSCULAR | Status: DC | PRN

## 2020-09-13 MED ORDER — EPHEDRINE 5 MG/ML INJ
INTRAVENOUS | Status: AC
  Filled 2020-09-13: qty 5

## 2020-09-13 MED ORDER — FENTANYL CITRATE (PF) 100 MCG/2ML IJ SOLN
INTRAMUSCULAR | Status: AC
  Filled 2020-09-13: qty 2

## 2020-09-13 MED ORDER — MIDAZOLAM HCL 2 MG/2ML IJ SOLN
INTRAMUSCULAR | Status: AC
  Filled 2020-09-13: qty 2

## 2020-09-13 MED ORDER — SODIUM CHLORIDE 0.9 % IR SOLN
Status: DC | PRN
  Administered 2020-09-13: 445 mL

## 2020-09-13 MED ORDER — LACTATED RINGERS IV SOLN: INTRAVENOUS | Status: DC

## 2020-09-13 MED ORDER — OXYCODONE HCL 5 MG/5ML PO SOLN: 5.0000 mg | Freq: Once | ORAL | Status: DC | PRN

## 2020-09-13 MED ORDER — KETOROLAC TROMETHAMINE 30 MG/ML IJ SOLN
INTRAMUSCULAR | Status: AC
  Filled 2020-09-13: qty 1

## 2020-09-13 MED ORDER — ORAL CARE MOUTH RINSE: 15.0000 mL | Freq: Once | OROMUCOSAL | Status: AC

## 2020-09-13 MED ORDER — PROPOFOL 10 MG/ML IV BOLUS
INTRAVENOUS | Status: AC
  Filled 2020-09-13: qty 20

## 2020-09-13 MED ORDER — DEXAMETHASONE SODIUM PHOSPHATE 10 MG/ML IJ SOLN
INTRAMUSCULAR | Status: DC | PRN
  Administered 2020-09-13: 10 mg via INTRAVENOUS

## 2020-09-13 MED ORDER — CHLORHEXIDINE GLUCONATE 0.12 % MT SOLN
OROMUCOSAL | Status: AC
  Administered 2020-09-13: 15 mL via OROMUCOSAL
  Filled 2020-09-13: qty 15

## 2020-09-13 MED ORDER — DEXAMETHASONE SODIUM PHOSPHATE 10 MG/ML IJ SOLN
INTRAMUSCULAR | Status: AC
  Filled 2020-09-13: qty 1

## 2020-09-13 MED ORDER — FENTANYL CITRATE (PF) 100 MCG/2ML IJ SOLN
INTRAMUSCULAR | Status: DC | PRN
  Administered 2020-09-13: 50 ug via INTRAVENOUS

## 2020-09-13 MED ORDER — SILVER NITRATE-POT NITRATE 75-25 % EX MISC
CUTANEOUS | Status: AC
  Filled 2020-09-13: qty 10

## 2020-09-13 MED ORDER — OXYCODONE HCL 5 MG PO TABS: 5.0000 mg | ORAL_TABLET | Freq: Once | ORAL | Status: DC | PRN

## 2020-09-13 MED ORDER — POVIDONE-IODINE 10 % EX SWAB: 2.0000 "application " | Freq: Once | CUTANEOUS | Status: DC

## 2020-09-13 MED ORDER — MIDAZOLAM HCL 2 MG/2ML IJ SOLN
INTRAMUSCULAR | Status: DC | PRN
  Administered 2020-09-13: 2 mg via INTRAVENOUS

## 2020-09-13 SURGICAL SUPPLY — 18 items
BACTOSHIELD CHG 4% 4OZ (MISCELLANEOUS) ×1
CATH ROBINSON RED A/P 16FR (CATHETERS) ×2 IMPLANT
DEVICE MYOSURE LITE (MISCELLANEOUS) IMPLANT
DEVICE MYOSURE REACH (MISCELLANEOUS) IMPLANT
GAUZE 4X4 16PLY ~~LOC~~+RFID DBL (SPONGE) ×4 IMPLANT
GLOVE SURG SYN 6.5 ES PF (GLOVE) ×2 IMPLANT
GLOVE SURG UNDER POLY LF SZ6.5 (GLOVE) ×4 IMPLANT
GOWN STRL REUS W/ TWL LRG LVL3 (GOWN DISPOSABLE) ×2 IMPLANT
GOWN STRL REUS W/TWL LRG LVL3 (GOWN DISPOSABLE) ×4
IV NS IRRIG 3000ML ARTHROMATIC (IV SOLUTION) ×2 IMPLANT
KIT PROCEDURE FLUENT (KITS) ×2 IMPLANT
MANIFOLD NEPTUNE II (INSTRUMENTS) ×2 IMPLANT
PACK DNC HYST (MISCELLANEOUS) ×2 IMPLANT
PAD OB MATERNITY 4.3X12.25 (PERSONAL CARE ITEMS) ×2 IMPLANT
PAD PREP 24X41 OB/GYN DISP (PERSONAL CARE ITEMS) ×2 IMPLANT
SCRUB CHG 4% DYNA-HEX 4OZ (MISCELLANEOUS) ×1 IMPLANT
SEAL ROD LENS SCOPE MYOSURE (ABLATOR) ×2 IMPLANT
TOWEL OR 17X26 4PK STRL BLUE (TOWEL DISPOSABLE) ×2 IMPLANT

## 2020-09-13 NOTE — Interval H&P Note (Signed)
History and Physical Interval Note:  09/13/2020 2:54 PM  Katherine Montgomery  has presented today for surgery, with the diagnosis of postmenopausal bleeding.  The various methods of treatment have been discussed with the patient and family. After consideration of risks, benefits and other options for treatment, the patient has consented to  Procedure(s): DILATATION AND CURETTAGE /HYSTEROSCOPY (N/A) as a surgical intervention.  The patient's history has been reviewed, patient examined, no change in status, stable for surgery.  I have reviewed the patient's chart and labs.  Questions were answered to the patient's satisfaction.     [place]

## 2020-09-13 NOTE — Anesthesia Procedure Notes (Signed)
Procedure Name: LMA Insertion Date/Time: 09/13/2020 3:38 PM Performed by: Jonna Clark, CRNA Pre-anesthesia Checklist: Patient identified, Patient being monitored, Timeout performed, Emergency Drugs available and Suction available Patient Re-evaluated:Patient Re-evaluated prior to induction Oxygen Delivery Method: Circle system utilized Preoxygenation: Pre-oxygenation with 100% oxygen Induction Type: IV induction Ventilation: Mask ventilation without difficulty LMA: LMA inserted LMA Size: 4.0 Tube type: Oral Number of attempts: 1 Placement Confirmation: positive ETCO2 and breath sounds checked- equal and bilateral Tube secured with: Tape Dental Injury: Teeth and Oropharynx as per pre-operative assessment

## 2020-09-13 NOTE — Anesthesia Postprocedure Evaluation (Signed)
Anesthesia Post Note  Patient: Katherine Montgomery  Procedure(s) Performed: DILATATION AND CURETTAGE /HYSTEROSCOPY  Patient location during evaluation: PACU Anesthesia Type: General Level of consciousness: awake and alert Pain management: pain level controlled Vital Signs Assessment: post-procedure vital signs reviewed and stable Respiratory status: spontaneous breathing, nonlabored ventilation, respiratory function stable and patient connected to nasal cannula oxygen Cardiovascular status: blood pressure returned to baseline and stable Postop Assessment: no apparent nausea or vomiting Anesthetic complications: no   No notable events documented.   Last Vitals:  Vitals:   09/13/20 1616 09/13/20 1620  BP: 110/65   Pulse:    Resp: 12   Temp:    SpO2: 100% 100%    Last Pain:  Vitals:   09/13/20 1614  PainSc: Asleep                 Arita Miss

## 2020-09-13 NOTE — Anesthesia Preprocedure Evaluation (Signed)
Anesthesia Evaluation  Patient identified by MRN, date of birth, ID band Patient awake    Reviewed: Allergy & Precautions, NPO status , Patient's Chart, lab work & pertinent test results  History of Anesthesia Complications Negative for: history of anesthetic complications  Airway Mallampati: II  TM Distance: >3 FB Neck ROM: Full    Dental no notable dental hx. (+) Teeth Intact   Pulmonary neg pulmonary ROS, neg sleep apnea, neg COPD, Patient abstained from smoking.Not current smoker,    Pulmonary exam normal breath sounds clear to auscultation       Cardiovascular Exercise Tolerance: Good METS(-) hypertension(-) CAD and (-) Past MI negative cardio ROS  (-) dysrhythmias  Rhythm:Regular Rate:Normal - Systolic murmurs    Neuro/Psych  Headaches, negative psych ROS   GI/Hepatic neg GERD  ,(+)     (-) substance abuse  ,   Endo/Other  neg diabetesHypothyroidism   Renal/GU negative Renal ROS     Musculoskeletal   Abdominal   Peds  Hematology   Anesthesia Other Findings Past Medical History: No date: Family history of breast cancer     Comment:  mother age 77 - pt's lifetime risk is 27.3% No date: Goiter No date: Hyperlipidemia No date: Hypothyroid No date: Ocular migraine  Reproductive/Obstetrics                             Anesthesia Physical Anesthesia Plan  ASA: 2  Anesthesia Plan: General   Post-op Pain Management:    Induction: Intravenous  PONV Risk Score and Plan: 4 or greater and Ondansetron, Propofol infusion, TIVA and Midazolam  Airway Management Planned: Natural Airway and LMA  Additional Equipment: None  Intra-op Plan:   Post-operative Plan:   Informed Consent: I have reviewed the patients History and Physical, chart, labs and discussed the procedure including the risks, benefits and alternatives for the proposed anesthesia with the patient or authorized  representative who has indicated his/her understanding and acceptance.     Dental advisory given  Plan Discussed with: CRNA and Surgeon  Anesthesia Plan Comments: (Discussed risks of anesthesia with patient, including possibility of difficulty with spontaneous ventilation under anesthesia necessitating airway intervention, PONV, and rare risks such as cardiac or respiratory or neurological events, and allergic reactions. Patient understands.)        Anesthesia Quick Evaluation

## 2020-09-13 NOTE — Op Note (Signed)
Operative Note  09/13/2020  PRE-OP DIAGNOSIS: Postmenopausal bleeding  POST-OP DIAGNOSIS: same   SURGEON: Binyamin Nelis MD  PROCEDURE: Procedure(s): DILATATION AND CURETTAGE /HYSTEROSCOPY   ANESTHESIA: Choice   ESTIMATED BLOOD LOSS: less than 5 cc   SPECIMENS:  endometrial curettings  FLUID DEFICIT: minimal  COMPLICATIONS: none  DISPOSITION: PACU - hemodynamically stable.  CONDITION: stable  FINDINGS: Exam under anesthesia revealed  6 cm uterus with bilateral adnexa without masses or fullness. Hysteroscopy revealed a normal uterine cavity with bilateral tubal ostia and normal appearing endocervical canal.  PROCEDURE IN DETAIL: After informed consent was obtained, the patient was taken to the operating room where anesthesia was obtained without difficulty. The patient was positioned in the dorsal lithotomy position in [place]. The patient's bladder was catheterized with an in and out foley catheter. The patient was examined under anesthesia, with the above noted findings. The weightedspeculum was placed inside the patient's vagina, and the the anterior lip of the cervix was seen and grasped with the tenaculum.  The uterine cavity was sounded to 6 cm, and then the cervix was progressively dilated to a 16 French-Pratt dilator. The 0 degree hysteroscope was introduced, with saline fluid used to distend the intrauterine cavity, with the above noted findings.  The hysteroscope was removed.  The uterine cavity was curetted until a gritty texture was noted, yielding endometrial curettings. All instruments were removed, with excellent hemostasis noted throughout. She was then taken out of dorsal lithotomy. Minimal discrepancy in fluid was noted.  The patient tolerated the procedure well. Sponge, lap and needle counts were correct x2. The patient was taken to recovery room in excellent condition.  Adrian Prows MD [facility], [facility] 09/13/2020 4:15  PM

## 2020-09-13 NOTE — Discharge Instructions (Addendum)
You may take 600 mg of ibuprofen every 6 hours as needed for pain. You will have light spotting and bleeding similar to a period for 7-14 days. Please contact me if you have heavy bleeding.    AMBULATORY SURGERY  DISCHARGE INSTRUCTIONS   The drugs that you were given will stay in your system until tomorrow so for the next 24 hours you should not:  Drive an automobile Make any legal decisions Drink any alcoholic beverage   You may resume regular meals tomorrow.  Today it is better to start with liquids and gradually work up to solid foods.  You may eat anything you prefer, but it is better to start with liquids, then soup and crackers, and gradually work up to solid foods.   Please notify your doctor immediately if you have any unusual bleeding, trouble breathing, redness and pain at the surgery site, drainage, fever, or pain not relieved by medication.    Your post-operative visit with Dr.                                       is: Date:                        Time:    Please call to schedule your post-operative visit.  Additional Instructions: AMBULATORY SURGERY  DISCHARGE INSTRUCTIONS   The drugs that you were given will stay in your system until tomorrow so for the next 24 hours you should not:  Drive an automobile Make any legal decisions Drink any alcoholic beverage   You may resume regular meals tomorrow.  Today it is better to start with liquids and gradually work up to solid foods.  You may eat anything you prefer, but it is better to start with liquids, then soup and crackers, and gradually work up to solid foods.   Please notify your doctor immediately if you have any unusual bleeding, trouble breathing, redness and pain at the surgery site, drainage, fever, or pain not relieved by medication.    Additional Instructions:        Please contact your physician with any problems or Same Day Surgery at [phone], Monday through Friday 6 am to 4 pm, or  [place] at [facility] number at [phone].

## 2020-09-13 NOTE — Transfer of Care (Signed)
Immediate Anesthesia Transfer of Care Note  Patient: Katherine Montgomery  Procedure(s) Performed: DILATATION AND CURETTAGE /HYSTEROSCOPY  Patient Location: PACU  Anesthesia Type:General  Level of Consciousness: drowsy and patient cooperative  Airway & Oxygen Therapy: Patient Spontanous Breathing and Patient connected to nasal cannula oxygen  Post-op Assessment: Report given to RN and Post -op Vital signs reviewed and stable  Post vital signs: Reviewed and stable  Last Vitals:  Vitals Value Taken Time  BP 110/65 09/13/20 1616  Temp 36.6 C 09/13/20 1614  Pulse 53 09/13/20 1616  Resp 12 09/13/20 1616  SpO2 100 % 09/13/20 1616  Vitals shown include unvalidated device data.  Last Pain:  Vitals:   09/13/20 1500  PainSc: 0-No pain         Complications: No notable events documented.

## 2020-09-14 ENCOUNTER — Encounter: Payer: Self-pay | Admitting: Obstetrics and Gynecology

## 2020-09-17 LAB — SURGICAL PATHOLOGY

## 2020-09-20 ENCOUNTER — Encounter: Payer: Self-pay | Admitting: Obstetrics and Gynecology

## 2020-09-20 ENCOUNTER — Other Ambulatory Visit: Payer: Self-pay

## 2020-09-20 ENCOUNTER — Ambulatory Visit (INDEPENDENT_AMBULATORY_CARE_PROVIDER_SITE_OTHER): Payer: BC Managed Care – PPO | Admitting: Obstetrics and Gynecology

## 2020-09-20 VITALS — BP 116/70 | Ht 70.0 in | Wt 163.6 lb

## 2020-09-20 DIAGNOSIS — N939 Abnormal uterine and vaginal bleeding, unspecified: Secondary | ICD-10-CM

## 2020-09-20 DIAGNOSIS — Z4889 Encounter for other specified surgical aftercare: Secondary | ICD-10-CM

## 2020-09-20 DIAGNOSIS — N950 Postmenopausal bleeding: Secondary | ICD-10-CM

## 2020-09-20 MED ORDER — MEDROXYPROGESTERONE ACETATE 5 MG PO TABS: 5.0000 mg | ORAL_TABLET | Freq: Every day | ORAL | 0 refills | Status: DC

## 2020-09-20 NOTE — Progress Notes (Signed)
  Postoperative Follow-up Patient presents post op from  Hysteroscopy with Dilation and Curettage  for  postmenoapual bleeding , 1 week ago.  Subjective: Patient reports some improvement in her preop symptoms. Eating a regular diet without difficulty. The patient is not having any pain.  Activity: normal activities of daily living. Patient reports additional symptom's since surgery of lower abdominal discomfort or bloating. Has long term constipation though and only 1 BM since surgery. She reports she has not had any vaginal bleeding  Objective: BP 116/70   Ht '5\' 10"'$  (1.778 m)   Wt 163 lb 9.6 oz (74.2 kg)   LMP 08/08/2020   BMI 23.47 kg/m  Physical Exam Constitutional:      Appearance: Normal appearance. She is well-developed.  HENT:     Head: Normocephalic and atraumatic.  Eyes:     Extraocular Movements: Extraocular movements intact.     Pupils: Pupils are equal, round, and reactive to light.  Neck:     Thyroid: No thyromegaly.  Cardiovascular:     Rate and Rhythm: Normal rate and regular rhythm.     Heart sounds: Normal heart sounds.  Pulmonary:     Effort: Pulmonary effort is normal.     Breath sounds: Normal breath sounds.  Abdominal:     General: Bowel sounds are normal. There is no distension.     Palpations: Abdomen is soft. There is no mass.  Musculoskeletal:     Cervical back: Neck supple.  Neurological:     Mental Status: She is alert and oriented to person, place, and time.  Skin:    General: Skin is warm and dry.  Psychiatric:        Behavior: Behavior normal.        Thought Content: Thought content normal.        Judgment: Judgment normal.  Vitals reviewed.    Assessment: s/p :  Hysteroscopy D&C stable  Plan: Patient has done well after surgery with no apparent complications.  I have discussed the post-operative course to date, and the expected progress moving forward.  The patient understands what complications to be concerned about.  I will see the  patient in routine follow up, or sooner if needed.    Activity plan: No restriction.  Rx for Provera sent.  Discussed that patient could take this if bleeding reoccurs.  She can take it for 5 days with any incidental bleeding or do a 30-day course to see if this helps resolve bleeding long-term.  Adrian Prows MD, [place], [facility] 09/20/2020 12:28 PM

## 2020-10-10 ENCOUNTER — Ambulatory Visit: Payer: BC Managed Care – PPO | Admitting: Dermatology

## 2020-10-10 ENCOUNTER — Other Ambulatory Visit: Payer: Self-pay

## 2020-10-10 DIAGNOSIS — L720 Epidermal cyst: Secondary | ICD-10-CM | POA: Diagnosis not present

## 2020-10-10 DIAGNOSIS — D492 Neoplasm of unspecified behavior of bone, soft tissue, and skin: Secondary | ICD-10-CM

## 2020-10-10 DIAGNOSIS — Z7189 Other specified counseling: Secondary | ICD-10-CM

## 2020-10-10 DIAGNOSIS — D2272 Melanocytic nevi of left lower limb, including hip: Secondary | ICD-10-CM | POA: Diagnosis not present

## 2020-10-10 DIAGNOSIS — B353 Tinea pedis: Secondary | ICD-10-CM

## 2020-10-10 DIAGNOSIS — D485 Neoplasm of uncertain behavior of skin: Secondary | ICD-10-CM

## 2020-10-10 MED ORDER — TERBINAFINE HCL 250 MG PO TABS: 250.0000 mg | ORAL_TABLET | Freq: Every day | ORAL | 1 refills | Status: AC

## 2020-10-10 NOTE — Progress Notes (Signed)
Follow-Up Visit   Subjective  Katherine Montgomery is a 55 y.o. female who presents for the following: Follow-up (1 mo f/u for tinea pedis. Pt treating with lamisil 250 mg qd and ketoconazole cream. Pt has noticed an improvement and is not having any issues with taking the lamisil. Pt also here to discuss further treatment of biopsy proven atypical melanocytic proliferation at the left lateral dorsum foot. Pt also has a spot on her right elbow that she would like checked today. ).  The following portions of the chart were reviewed this encounter and updated as appropriate:  Tobacco  Allergies  Meds  Problems  Med Hx  Surg Hx  Fam Hx     Review of Systems: No other skin or systemic complaints except as noted in HPI or Assessment and Plan.  Objective  Well appearing patient in no apparent distress; mood and affect are within normal limits.  A focused examination was performed including feet, right elbow. Relevant physical exam findings are noted in the Assessment and Plan.  Right Foot - Anterior Scaling and maceration web spaces and over distal and lateral soles.  4th toenail involvement  left lateral dorsum foot Biopsy proven Atypical melanocytic Proliferation     Right Elbow - Posterior 0.7 cm firm subcutaneous nodule  Assessment & Plan  Tinea pedis of both feet Right Foot - Anterior Chronic and persistent Improving. Tolerating lamisil. No liver problems.  Continue Lamisil 250 mg. Take 1 po qd x 1 mo. Then repeat for another month. Continue Ketoconazole cream.   Terbinafine Counseling Terbinafine is an anti-fungal medicine that can be applied to the skin (over the counter) or taken by mouth (prescription) to treat fungal infections. The pill version is often used to treat fungal infections of the nails or scalp. While most people do not have any side effects from taking terbinafine pills, some possible side effects of the medicine can include taste changes, headache, loss of  smell, vision changes, nausea, vomiting, or diarrhea.   Rare side effects can include irritation of the liver, allergic reaction, or decrease in blood counts (which may show up as not feeling well or developing an infection). If you are concerned about any of these side effects, please stop the medicine and call your doctor, or in the case of an emergency such as feeling very unwell, seek immediate medical care.   Related Medications ketoconazole (NIZORAL) 2 % cream Apply 1 application topically daily. Qhs to feet, in between toes and on sides of feet terbinafine (LAMISIL) 250 MG tablet Take 1 tablet (250 mg total) by mouth daily.  Neoplasm of uncertain behavior of skin (2) left lateral dorsum foot -biopsy-proven atypical melanocytic proliferation Right Elbow - Posterior -suspected inflamed cyst  Skin excision -suspected inflamed symptomatic cyst of the right elbow  Total excision diameter (cm):  0.7 Informed consent: discussed and consent obtained   Timeout: patient name, date of birth, surgical site, and procedure verified   Procedure prep:  Patient was prepped and draped in usual sterile fashion Prep type:  Isopropyl alcohol and povidone-iodine Anesthesia: the lesion was anesthetized in a standard fashion   Anesthetic:  1% lidocaine w/ epinephrine 1-100,000 buffered w/ 8.4% NaHCO3 Instrument used: #15 blade   Hemostasis achieved with: pressure   Hemostasis achieved with comment:  Electrocautery Outcome: patient tolerated procedure well with no complications   Post-procedure details: sterile dressing applied and wound care instructions given   Dressing type: bandage and pressure dressing (mupirocin)    Specimen 2 -  Surgical pathology Differential Diagnosis: cyst vs other  Check Margins: No  Plan in office excision of biopsy-proven atypical melanocytic proliferation of the left lateral dorsum foot after pts trip to [place] in October.   Return for surgery.  IHarriett Sine,  CMA, am acting as scribe for Sarina Ser, MD. Documentation: I have reviewed the above documentation for accuracy and completeness, and I agree with the above.  Sarina Ser, MD

## 2020-10-13 ENCOUNTER — Encounter: Payer: Self-pay | Admitting: Dermatology

## 2020-10-19 ENCOUNTER — Telehealth: Payer: Self-pay

## 2020-10-19 NOTE — Telephone Encounter (Signed)
Advised pt of bx results/sh ?

## 2020-10-19 NOTE — Telephone Encounter (Signed)
-----   Message from Ralene Bathe, MD sent at 10/12/2020  6:49 PM EDT ----- Diagnosis Skin , right elbow - posterior CONSISTENT WITH SURFACE OF AN EPIDERMOID CYST  Benign cyst May persist or recur No further treatment at this time

## 2020-10-26 ENCOUNTER — Other Ambulatory Visit: Payer: Self-pay | Admitting: Obstetrics and Gynecology

## 2020-10-26 DIAGNOSIS — N950 Postmenopausal bleeding: Secondary | ICD-10-CM

## 2020-10-26 DIAGNOSIS — N939 Abnormal uterine and vaginal bleeding, unspecified: Secondary | ICD-10-CM

## 2020-12-04 ENCOUNTER — Ambulatory Visit: Payer: BC Managed Care – PPO | Admitting: Dermatology

## 2020-12-04 ENCOUNTER — Other Ambulatory Visit: Payer: Self-pay

## 2020-12-04 ENCOUNTER — Encounter: Payer: Self-pay | Admitting: Dermatology

## 2020-12-04 DIAGNOSIS — D485 Neoplasm of uncertain behavior of skin: Secondary | ICD-10-CM

## 2020-12-04 DIAGNOSIS — D2272 Melanocytic nevi of left lower limb, including hip: Secondary | ICD-10-CM | POA: Diagnosis not present

## 2020-12-04 DIAGNOSIS — D229 Melanocytic nevi, unspecified: Secondary | ICD-10-CM

## 2020-12-04 HISTORY — DX: Melanocytic nevi, unspecified: D22.9

## 2020-12-04 MED ORDER — MUPIROCIN 2 % EX OINT: 1.0000 "application " | TOPICAL_OINTMENT | Freq: Every day | CUTANEOUS | 0 refills | Status: DC

## 2020-12-04 NOTE — Patient Instructions (Signed)
Wound Care Instructions  Cleanse wound gently with soap and water once a day then pat dry with clean gauze. Apply a thing coat of Petrolatum (petroleum jelly, "Vaseline") over the wound (unless you have an allergy to this). We recommend that you use a new, sterile tube of Vaseline. Do not pick or remove scabs. Do not remove the yellow or white "healing tissue" from the base of the wound.  Cover the wound with fresh, clean, nonstick gauze and secure with paper tape. You may use Band-Aids in place of gauze and tape if the would is small enough, but would recommend trimming much of the tape off as there is often too much. Sometimes Band-Aids can irritate the skin.  You should call the office for your biopsy report after 1 week if you have not already been contacted.  If you experience any problems, such as abnormal amounts of bleeding, swelling, significant bruising, significant pain, or evidence of infection, please call the office immediately.  FOR ADULT SURGERY PATIENTS: If you need something for pain relief you may take 1 extra strength Tylenol (acetaminophen) AND 2 Ibuprofen (200mg each) together every 4 hours as needed for pain. (do not take these if you are allergic to them or if you have a reason you should not take them.) Typically, you may only need pain medication for 1 to 3 days.   If you have any questions or concerns for your doctor, please call our main line at [phone] and press option 4 to reach your doctor's medical assistant. If no one answers, please leave a voicemail as directed and we will return your call as soon as possible. Messages left after 4 pm will be answered the following business day.   You may also send us a message via MyChart. We typically respond to MyChart messages within 1-2 business days.  For prescription refills, please ask your pharmacy to contact our office. Our fax number is [phone].  If you have an urgent issue when the clinic is closed that  cannot wait until the next business day, you can page your doctor at the number below.    Please note that while we do our best to be available for urgent issues outside of office hours, we are not available 24/7.   If you have an urgent issue and are unable to reach us, you may choose to seek medical care at your doctor's office, retail clinic, urgent care center, or emergency room.  If you have a medical emergency, please immediately call 911 or go to the emergency department.  Pager Numbers  - Dr. Kowalski: [phone]  - Dr. Moye: [phone]  - Dr. Stewart: [phone]  In the event of inclement weather, please call our main line at [phone] for an update on the status of any delays or closures.  Dermatology Medication Tips: Please keep the boxes that topical medications come in in order to help keep track of the instructions about where and how to use these. Pharmacies typically print the medication instructions only on the boxes and not directly on the medication tubes.   If your medication is too expensive, please contact our office at [phone] option 4 or send us a message through MyChart.   We are unable to tell what your co-pay for medications will be in advance as this is different depending on your insurance coverage. However, we may be able to find a substitute medication at lower cost or fill out paperwork to get insurance to cover a needed   medication.   If a prior authorization is required to get your medication covered by your insurance company, please allow us 1-2 business days to complete this process.  Drug prices often vary depending on where the prescription is filled and some pharmacies may offer cheaper prices.  The website www.goodrx.com contains coupons for medications through different pharmacies. The prices here do not account for what the cost may be with help from insurance (it may be cheaper with your insurance), but the website can give you the  price if you did not use any insurance.  - You can print the associated coupon and take it with your prescription to the pharmacy.  - You may also stop by our office during regular business hours and pick up a GoodRx coupon card.  - If you need your prescription sent electronically to a different pharmacy, notify our office through [facility] MyChart or by phone at [phone] option 4.   

## 2020-12-04 NOTE — Progress Notes (Signed)
   Follow-Up Visit   Subjective  Katherine Montgomery is a 55 y.o. female who presents for the following: Procedure (Biopsy proven atypical melanocytic proliferation of left lat dorsum foot - [facility] today).  The following portions of the chart were reviewed this encounter and updated as appropriate:   Tobacco  Allergies  Meds  Problems  Med Hx  Surg Hx  Fam Hx     Review of Systems:  No other skin or systemic complaints except as noted in HPI or Assessment and Plan.  Objective  Well appearing patient in no apparent distress; mood and affect are within normal limits.  A focused examination was performed including left foot. Relevant physical exam findings are noted in the Assessment and Plan.  Left lat dorsum foot Healing biopsy site        Assessment & Plan  Neoplasm of uncertain behavior of skin Left lat dorsum foot  Skin excision  Total excision diameter (cm):  1.4 Informed consent: discussed and consent obtained   Timeout: patient name, date of birth, surgical site, and procedure verified   Procedure prep:  Patient was prepped and draped in usual sterile fashion Prep type:  Isopropyl alcohol and povidone-iodine Anesthesia: the lesion was anesthetized in a standard fashion   Anesthetic:  1% lidocaine w/ epinephrine 1-100,000 buffered w/ 8.4% NaHCO3 Instrument used: #15 blade   Hemostasis achieved with: pressure   Hemostasis achieved with comment:  Electrocautery Outcome: patient tolerated procedure well with no complications   Post-procedure details: sterile dressing applied and wound care instructions given   Dressing type: bandage and pressure dressing (mupirocin)    mupirocin ointment (BACTROBAN) 2 % Apply 1 application topically daily. With dressing changes  Skin repair Complexity:  Intermediate Final length (cm):  2 Informed consent: discussed and consent obtained   Reason for type of repair: reduce tension to allow closure, reduce the risk of dehiscence,  infection, and necrosis, reduce subcutaneous dead space and avoid a hematoma, allow closure of the large defect, preserve normal anatomy, preserve normal anatomical and functional relationships and enhance both functionality and cosmetic results   Undermining: edges undermined   Subcutaneous layers (deep stitches):  Suture size:  4-0 Suture type: Vicryl (polyglactin 910)   Subcutaneous suture technique: inverted dermal. Fine/surface layer approximation (top stitches):  Suture type comment:  Nylon Stitches: simple running   Hemostasis achieved with: suture and pressure Hemostasis achieved with comment:  Electrocautery Outcome: patient tolerated procedure well with no complications   Post-procedure details: sterile dressing applied and wound care instructions given   Dressing type: pressure dressing and bacitracin (mupirocin)    Specimen 1 - Surgical pathology Differential Diagnosis: Biopsy proven Atypical Melanocytic Proliferation Check Margins: No [id number] Suture at prox 12 oclock edge  Sutured at proximal 12 oclock edge - see photo  Return in about 9 days (around 12/13/2020) for suture removal.  I, Ashok Cordia, CMA, am acting as scribe for Sarina Ser, MD . Documentation: I have reviewed the above documentation for accuracy and completeness, and I agree with the above.  Sarina Ser, MD

## 2020-12-05 ENCOUNTER — Telehealth: Payer: Self-pay

## 2020-12-05 NOTE — Telephone Encounter (Signed)
Left message for patient regarding surgery/hd  

## 2020-12-07 ENCOUNTER — Telehealth: Payer: Self-pay

## 2020-12-07 NOTE — Telephone Encounter (Signed)
-----   Message from Ralene Bathe, MD sent at 12/07/2020  9:55 AM EST ----- Diagnosis Skin (M), left lat dorsum foot NO RESIDUAL ATYPICAL MELANOCYTIC PROLIFERATION  Site of Atypical melanocytic Proliferation No residual Margins free

## 2020-12-07 NOTE — Telephone Encounter (Signed)
Patient informed of pathology results 

## 2020-12-10 ENCOUNTER — Telehealth: Payer: Self-pay | Admitting: Gastroenterology

## 2020-12-10 NOTE — Telephone Encounter (Signed)
COLONOSCOPY (Pts 45-60yrs Insurance coverage will need to be confirmed) (Every 3 Years)  Overdue since 09/25/2020       Transcription  Type ID Date and Time Dictating Provider  Colonoscopy [id number] 09/25/2017 12:25 PM Virgel Manifold, MD  Signed by Virgel Manifold, MD on 09/25/17 at 1225  Display only: Transcription ([id number]) on 09/25/2017 12:25 PM by Virgel Manifold, MD                        Patient is ready to get procedure scheduled

## 2020-12-12 ENCOUNTER — Other Ambulatory Visit: Payer: Self-pay

## 2020-12-12 DIAGNOSIS — Z1211 Encounter for screening for malignant neoplasm of colon: Secondary | ICD-10-CM

## 2020-12-12 DIAGNOSIS — Z86010 Personal history of colonic polyps: Secondary | ICD-10-CM

## 2020-12-12 MED ORDER — SUTAB 1479-225-188 MG PO TABS: 12.0000 | ORAL_TABLET | Freq: Once | ORAL | 0 refills | Status: AC

## 2020-12-12 NOTE — Progress Notes (Signed)
Gastroenterology Pre-Procedure Review  Request Date: 01/10/21 Requesting Physician: Dr. Bonna Gains  PATIENT REVIEW QUESTIONS: The patient responded to the following health history questions as indicated:  3 year Recall  1. Are you having any GI issues? no 2. Do you have a personal history of Polyps? yes (09/25/2017 polyps removed.) 3. Do you have a family history of Colon Cancer or Polyps? yes (Mother- polyps) 4. Diabetes Mellitus? no 5. Joint replacements in the past 12 months?no 6. Major health problems in the past 3 months?no 7. Any artificial heart valves, MVP, or defibrillator?no    MEDICATIONS & ALLERGIES:    Patient reports the following regarding taking any anticoagulation/antiplatelet therapy:   Plavix, Coumadin, Eliquis, Xarelto, Lovenox, Pradaxa, Brilinta, or Effient? no Aspirin? no  Patient confirms/reports the following medications:  Current Outpatient Medications  Medication Sig Dispense Refill   clobetasol (TEMOVATE) 0.05 % external solution Apply 1 application topically as directed. Apply to aa scalp 3 days weekly, avoid face, groin, axilla (Patient taking differently: Apply 1 application topically daily as needed (dermatitis). Apply to aa scalp, avoid face, groin, axilla) 50 mL 2   ibuprofen (ADVIL) 200 MG tablet Take 400 mg by mouth every 6 (six) hours as needed for headache.     ketoconazole (NIZORAL) 2 % cream Apply 1 application topically daily. Qhs to feet, in between toes and on sides of feet 60 g 3   ketoconazole (NIZORAL) 2 % shampoo Apply 1 application topically 2 (two) times a week. Let sit 5 minutes and rinse out 120 mL 6   levothyroxine (SYNTHROID, LEVOTHROID) 112 MCG tablet Take 112 mcg by mouth daily before breakfast.     medroxyPROGESTERone (PROVERA) 5 MG tablet TAKE 1 TABLET(5 MG) BY MOUTH DAILY 30 tablet 0   mupirocin ointment (BACTROBAN) 2 % Apply 1 application topically daily. With dressing changes 22 g 0   rosuvastatin (CRESTOR) 40 MG tablet Take 40  mg by mouth daily.     terbinafine (LAMISIL) 250 MG tablet Take 1 tablet (250 mg total) by mouth daily. 30 tablet 1   Vitamin D, Ergocalciferol, (DRISDOL) 1.25 MG (50000 UNIT) CAPS capsule Take 50,000 Units by mouth every Monday.     No current facility-administered medications for this visit.    Patient confirms/reports the following allergies:  No Known Allergies  No orders of the defined types were placed in this encounter.   AUTHORIZATION INFORMATION Primary Insurance: 1D#: Group #:  Secondary Insurance: 1D#: Group #:  SCHEDULE INFORMATION: Date: 01/10/21  Time: Location: [facility]

## 2020-12-12 NOTE — Telephone Encounter (Signed)
Returned patients call. LVM to call the office back.

## 2020-12-12 NOTE — Telephone Encounter (Signed)
Procedure schedule for 01/10/21.

## 2020-12-12 NOTE — Telephone Encounter (Signed)
Patient was returning phone call 

## 2020-12-13 ENCOUNTER — Ambulatory Visit (INDEPENDENT_AMBULATORY_CARE_PROVIDER_SITE_OTHER): Payer: BC Managed Care – PPO | Admitting: Dermatology

## 2020-12-13 ENCOUNTER — Other Ambulatory Visit: Payer: Self-pay

## 2020-12-13 DIAGNOSIS — D229 Melanocytic nevi, unspecified: Secondary | ICD-10-CM

## 2020-12-13 DIAGNOSIS — D485 Neoplasm of uncertain behavior of skin: Secondary | ICD-10-CM

## 2020-12-13 DIAGNOSIS — Z4802 Encounter for removal of sutures: Secondary | ICD-10-CM

## 2020-12-13 NOTE — Patient Instructions (Addendum)
Continue to apply ointment and cover with bandaide daily until healed   Please call our office at [phone] for any questions or concerns.  If You Need Anything After Your Visit  If you have any questions or concerns for your doctor, please call our main line at [phone] and press option 4 to reach your doctor's medical assistant. If no one answers, please leave a voicemail as directed and we will return your call as soon as possible. Messages left after 4 pm will be answered the following business day.   You may also send [place] a message via [place]. We typically respond to MyChart messages within 1-2 business days.  For prescription refills, please ask your pharmacy to contact our office. Our fax number is [phone].  If you have an urgent issue when the clinic is closed that cannot wait until the next business day, you can page your doctor at the number below.    Please note that while we do our best to be available for urgent issues outside of office hours, we are not available 24/7.   If you have an urgent issue and are unable to reach [place], you may choose to seek medical care at your doctor's office, retail clinic, urgent care center, or emergency room.  If you have a medical emergency, please immediately call 911 or go to the emergency department.  Pager Numbers  - Dr. Nehemiah Massed: [phone]  - Dr. Laurence Ferrari: [phone]  - Dr. Nicole Kindred: [phone]  In the event of inclement weather, please call our main line at [phone] for an update on the status of any delays or closures.  Dermatology Medication Tips: Please keep the boxes that topical medications come in in order to help keep track of the instructions about where and how to use these. Pharmacies typically print the medication instructions only on the boxes and not directly on the medication tubes.   If your medication is too expensive, please contact our office at [phone] option 4 or send [place] a message through  [place].   We are unable to tell what your co-pay for medications will be in advance as this is different depending on your insurance coverage. However, we may be able to find a substitute medication at lower cost or fill out paperwork to get insurance to cover a needed medication.   If a prior authorization is required to get your medication covered by your insurance company, please allow [place] 1-2 business days to complete this process.  Drug prices often vary depending on where the prescription is filled and some pharmacies may offer cheaper prices.  The website www.goodrx.com contains coupons for medications through different pharmacies. The prices here do not account for what the cost may be with help from insurance (it may be cheaper with your insurance), but the website can give you the price if you did not use any insurance.  - You can print the associated coupon and take it with your prescription to the pharmacy.  - You may also stop by our office during regular business hours and pick up a GoodRx coupon card.  - If you need your prescription sent electronically to a different pharmacy, notify our office through [facility] or by phone at [phone] option 4.     Si Usted Necesita Algo Despus de Su Visita  Tambin puede enviarnos un mensaje a travs de Pharmacist, community. Por lo general respondemos a los mensajes de MyChart en el transcurso de 1 a 2 das hbiles.  Para renovar  recetas, por favor pida a su farmacia que se ponga en contacto con nuestra oficina. Harland Dingwall de fax es [employer] [phone].  Si tiene un asunto urgente cuando la clnica est cerrada y que no puede esperar hasta el siguiente da hbil, puede llamar/localizar a su doctor(a) al nmero que aparece a continuacin.   Por favor, tenga en cuenta que aunque hacemos todo lo posible para estar disponibles para asuntos urgentes fuera del horario de [place], no estamos disponibles las 24 horas del da, los 7 das de la  [place].   Si tiene un problema urgente y no puede comunicarse con nosotros, puede optar por buscar atencin mdica  en el consultorio de su doctor(a), en una clnica privada, en un centro de atencin urgente o en una sala de emergencias.  Si tiene Engineering geologist, por favor llame inmediatamente al 911 o vaya a la sala de emergencias.  Nmeros de bper  - Dr. Nehemiah Massed: [phone]  - Dra. Moye: [phone]  - Dra. Nicole Kindred: [phone]  En caso de inclemencias del [place], por favor llame a Johnsie Kindred principal al [phone] para una actualizacin sobre el [place] de cualquier retraso o cierre.  Consejos para la medicacin en dermatologa: Por favor, guarde las cajas en las que vienen los medicamentos de uso tpico para ayudarle a seguir las instrucciones sobre dnde y cmo usarlos. Las farmacias generalmente imprimen las instrucciones del medicamento slo en las cajas y no directamente en los tubos del [place].   Si su medicamento es muy caro, por favor, pngase en contacto con Zigmund Daniel llamando al [phone] y presione la opcin 4 o envenos un mensaje a travs de Pharmacist, community.   No podemos decirle cul ser su copago por los medicamentos por adelantado ya que esto es diferente dependiendo de la cobertura de su seguro. Sin embargo, es posible que podamos encontrar un medicamento sustituto a Electrical engineer un formulario para que el seguro cubra el medicamento que se considera necesario.   Si se requiere una autorizacin previa para que su compaa de seguros [place] su medicamento, por favor permtanos de 1 a 2 das hbiles para completar este proceso.  Los precios de los medicamentos varan con frecuencia dependiendo del Environmental consultant de dnde se surte la receta y alguna farmacias pueden ofrecer precios ms baratos.  El sitio web www.goodrx.com tiene cupones para medicamentos de Airline pilot. Los precios aqu no tienen en cuenta lo que podra costar con la ayuda del  seguro (puede ser ms barato con su seguro), pero el sitio web puede darle el precio si no utiliz Research scientist (physical sciences).  - Puede imprimir el cupn correspondiente y llevarlo con su receta a la farmacia.  - Tambin puede pasar por nuestra oficina durante el horario de atencin regular y Charity fundraiser una tarjeta de cupones de GoodRx.  - Si necesita que su receta se enve electrnicamente a una farmacia diferente, informe a nuestra oficina a travs de MyChart de [place] o por telfono llamando al [phone] y presione la opcin 4.

## 2020-12-13 NOTE — Progress Notes (Signed)
   Follow-Up Visit   Subjective  Katherine Montgomery is a 55 y.o. female who presents for the following: Suture / Staple Removal (Patient here today for suture removal at left lateral foot. Patient reports some soreness but believes it is healing. ).  The following portions of the chart were reviewed this encounter and updated as appropriate:  Tobacco  Allergies  Meds  Problems  Med Hx  Surg Hx  Fam Hx     Review of Systems: No other skin or systemic complaints except as noted in HPI or Assessment and Plan.  Objective  Well appearing patient in no apparent distress; mood and affect are within normal limits.  A focused examination was performed including left lateral dorsum foot. Relevant physical exam findings are noted in the Assessment and Plan.  Assessment & Plan  Encounter for Removal of Sutures - Incision site at the left lateral dorsum foot  is clean, dry and intact - Wound cleansed, sutures removed, wound cleansed and steri strips applied.  - Discussed pathology results showing ), left lat dorsum foot NO RESIDUAL ATYPICAL MELANOCYTIC PROLIFERATION  - Patient advised to keep steri-strips dry until they fall off. - Scars remodel for a full year. - Once steri-strips fall off, patient can apply over-the-counter silicone scar cream each night to help with scar remodeling if desired. - Patient advised to call with any concerns or if they notice any new or changing lesions.  Return for 3 month follow up tbse .  IRuthell Rummage, CMA, am acting as scribe for Sarina Ser, MD. Documentation: I have reviewed the above documentation for accuracy and completeness, and I agree with the above.  Sarina Ser, MD

## 2020-12-28 ENCOUNTER — Encounter: Payer: Self-pay | Admitting: Dermatology

## 2021-01-09 ENCOUNTER — Encounter: Payer: Self-pay | Admitting: Gastroenterology

## 2021-01-10 ENCOUNTER — Ambulatory Visit: Payer: BC Managed Care – PPO | Admitting: Registered Nurse

## 2021-01-10 ENCOUNTER — Encounter: Payer: Self-pay | Admitting: Gastroenterology

## 2021-01-10 ENCOUNTER — Ambulatory Visit
Admission: RE | Admit: 2021-01-10 | Discharge: 2021-01-10 | Disposition: A | Discharge status: Home / Self Care | Payer: BC Managed Care – PPO | Attending: Gastroenterology | Admitting: Gastroenterology

## 2021-01-10 ENCOUNTER — Other Ambulatory Visit: Payer: Self-pay

## 2021-01-10 ENCOUNTER — Encounter
Admission: RE | Disposition: A | Discharge status: Home / Self Care | Payer: Self-pay | Source: Home / Self Care | Attending: Gastroenterology

## 2021-01-10 DIAGNOSIS — Z860100 Personal history of colon polyps, unspecified: Secondary | ICD-10-CM

## 2021-01-10 DIAGNOSIS — Z1211 Encounter for screening for malignant neoplasm of colon: Secondary | ICD-10-CM | POA: Insufficient documentation

## 2021-01-10 DIAGNOSIS — D120 Benign neoplasm of cecum: Secondary | ICD-10-CM | POA: Insufficient documentation

## 2021-01-10 DIAGNOSIS — K635 Polyp of colon: Secondary | ICD-10-CM

## 2021-01-10 HISTORY — DX: Nonrheumatic mitral (valve) prolapse: I34.1

## 2021-01-10 HISTORY — PX: COLONOSCOPY WITH PROPOFOL: SHX5780

## 2021-01-10 HISTORY — DX: Cardiac murmur, unspecified: R01.1

## 2021-01-10 SURGERY — COLONOSCOPY WITH PROPOFOL
Anesthesia: General

## 2021-01-10 MED ORDER — SODIUM CHLORIDE 0.9 % IV SOLN: INTRAVENOUS | Status: DC

## 2021-01-10 MED ORDER — LIDOCAINE HCL (CARDIAC) PF 100 MG/5ML IV SOSY
PREFILLED_SYRINGE | INTRAVENOUS | Status: DC | PRN
  Administered 2021-01-10: 40 mg via INTRAVENOUS

## 2021-01-10 MED ORDER — PROPOFOL 10 MG/ML IV BOLUS
INTRAVENOUS | Status: DC | PRN
  Administered 2021-01-10 (×3): 20 mg via INTRAVENOUS
  Administered 2021-01-10: 50 mg via INTRAVENOUS

## 2021-01-10 MED ORDER — PROPOFOL 500 MG/50ML IV EMUL
INTRAVENOUS | Status: DC | PRN
  Administered 2021-01-10: 150 ug/kg/min via INTRAVENOUS

## 2021-01-10 MED ORDER — PROPOFOL 500 MG/50ML IV EMUL
INTRAVENOUS | Status: AC
  Filled 2021-01-10: qty 50

## 2021-01-10 MED ORDER — LIDOCAINE HCL (PF) 2 % IJ SOLN
INTRAMUSCULAR | Status: AC
  Filled 2021-01-10: qty 5

## 2021-01-10 NOTE — Anesthesia Procedure Notes (Signed)
Date/Time: 01/10/2021 9:02 AM Performed by: Doreen Salvage, CRNA Pre-anesthesia Checklist: Patient identified, Emergency Drugs available, Suction available, Patient being monitored and Timeout performed Patient Re-evaluated:Patient Re-evaluated prior to induction Oxygen Delivery Method: Nasal cannula Induction Type: IV induction

## 2021-01-10 NOTE — Transfer of Care (Signed)
Immediate Anesthesia Transfer of Care Note  Patient: Katherine Montgomery  Procedure(s) Performed: Procedure(s): COLONOSCOPY WITH PROPOFOL (N/A)  Patient Location: PACU and Endoscopy Unit  Anesthesia Type:General  Level of Consciousness: sedated  Airway & Oxygen Therapy: Patient Spontanous Breathing and Patient connected to nasal cannula oxygen  Post-op Assessment: Report given to RN and Post -op Vital signs reviewed and stable  Post vital signs: Reviewed and stable  Last Vitals:  Vitals:   01/10/21 0848 01/10/21 0959  BP: 128/84 (!) 116/59  Pulse: 75 70  Resp: 16 11  Temp: 36.6 C (!) 36.4 C  SpO2: 254% 98%    Complications: No apparent anesthesia complications

## 2021-01-10 NOTE — H&P (Signed)
Vonda Antigua, MD [address] [place], [place], [place], [id number] [id number], [place], [place], [place], [address] Phone: [phone]  Fax: [phone]  Primary Care Physician:  Lenard Simmer, MD   Pre-Procedure History & Physical: HPI:  Katherine Montgomery is a 55 y.o. female is here for a colonoscopy.   Past Medical History:  Diagnosis Date   Atypical mole 12/04/2020   Left lat dorsum foot - Atypical Melanocytic Proliferation - excised   Family history of breast cancer    mother age 54 - pt's lifetime risk is 27.3%   Goiter    Heart murmur    Hyperlipidemia    Hypothyroid    Mitral valve prolapse    Ocular migraine     Past Surgical History:  Procedure Laterality Date   COLONOSCOPY WITH PROPOFOL N/A 09/25/2017   Procedure: COLONOSCOPY WITH PROPOFOL;  Surgeon: Virgel Manifold, MD;  Location: [facility] ENDOSCOPY;  Service: Endoscopy;  Laterality: N/A;   DILATION AND CURETTAGE OF UTERUS     HYSTEROSCOPY WITH D & C N/A 09/13/2020   Procedure: DILATATION AND CURETTAGE /HYSTEROSCOPY;  Surgeon: Homero Fellers, MD;  Location: [facility] ORS;  Service: Gynecology;  Laterality: N/A;   THYROIDECTOMY, PARTIAL  2006   left hemithyroidectomy    Prior to Admission medications   Medication Sig Start Date End Date Taking? Authorizing Provider  clobetasol (TEMOVATE) 0.05 % external solution Apply 1 application topically as directed. Apply to aa scalp 3 days weekly, avoid face, groin, axilla Patient taking differently: Apply 1 application topically daily as needed (dermatitis). Apply to aa scalp, avoid face, groin, axilla 09/03/20  Yes Ralene Bathe, MD  ketoconazole (NIZORAL) 2 % cream Apply 1 application topically daily. Qhs to feet, in between toes and on sides of feet 09/03/20  Yes Ralene Bathe, MD  levothyroxine (SYNTHROID, LEVOTHROID) 112 MCG tablet Take 112 mcg by mouth daily before breakfast.   Yes [provider]  rosuvastatin (CRESTOR) 40 MG tablet  Take 40 mg by mouth daily.   Yes [provider]  terbinafine (LAMISIL) 250 MG tablet Take 1 tablet (250 mg total) by mouth daily. 10/10/20  Yes Ralene Bathe, MD  Vitamin D, Ergocalciferol, (DRISDOL) 1.25 MG (50000 UNIT) CAPS capsule Take 50,000 Units by mouth every Monday.   Yes [provider]  ibuprofen (ADVIL) 200 MG tablet Take 400 mg by mouth every 6 (six) hours as needed for headache.    [provider]  ketoconazole (NIZORAL) 2 % shampoo Apply 1 application topically 2 (two) times a week. Let sit 5 minutes and rinse out 09/03/20   Ralene Bathe, MD  medroxyPROGESTERone (PROVERA) 5 MG tablet TAKE 1 TABLET(5 MG) BY MOUTH DAILY 10/26/20   Schuman, Stefanie Libel, MD  mupirocin ointment (BACTROBAN) 2 % Apply 1 application topically daily. With dressing changes 12/04/20   Ralene Bathe, MD    Allergies as of 12/12/2020   (No Known Allergies)    Family History  Problem Relation Age of Onset   Breast cancer Mother 9   Stroke Mother 21   Brain cancer Father 37   Colon cancer Maternal Grandfather 53    Social History   Socioeconomic History   Marital status: Married    Spouse name: Not on file   Number of children: 0   Years of education: 16   Highest education level: Not on file  Occupational History   Occupation: Banker  Tobacco Use   Smoking status: Never  Smokeless tobacco: Never  Vaping Use   Vaping Use: Never used  Substance and Sexual Activity   Alcohol use: Yes    Comment: NONE LAST 24HRS   Drug use: No   Sexual activity: Yes    Partners: Male    Birth control/protection: Pill  Other Topics Concern   Not on file  Social History Narrative   Not on file   Social Determinants of Health   Financial Resource Strain: Not on file  Food Insecurity: Not on file  Transportation Needs: Not on file  Physical Activity: Not on file  Stress: Not on file  Social Connections: Not on file  Intimate Partner Violence: Not on file     Review of Systems: See HPI, otherwise negative ROS  Physical Exam: Constitutional: General:   Alert,  Well-developed, well-nourished, pleasant and cooperative in NAD BP 128/84    Pulse 75    Temp 97.8 F (36.6 C) (Temporal)    Resp 16    Ht 5\' 10"  (1.778 m)    Wt 73.5 kg    SpO2 100%    BMI 23.24 kg/m   Head: Normocephalic, atraumatic.   Eyes:  Sclera clear, no icterus.   Conjunctiva pink.   Mouth:  No deformity or lesions, oropharynx pink & moist.  Neck:  Supple, trachea midline  Respiratory: Normal respiratory effort  Gastrointestinal:  Soft, non-tender and non-distended without masses, hepatosplenomegaly or hernias noted.  No guarding or rebound tenderness.     Cardiac: No clubbing or edema.  No cyanosis. Normal posterior tibial pedal pulses noted.  Lymphatic:  No significant cervical adenopathy.  Psych:  Alert and cooperative. Normal mood and affect.  Musculoskeletal:   Symmetrical without gross deformities. 5/5 Lower extremity strength bilaterally.  Skin: Warm. Intact without significant lesions or rashes. No jaundice.  Neurologic:  Face symmetrical, tongue midline, Normal sensation to touch;  grossly normal neurologically.  Psych:  Alert and oriented x3, Alert and cooperative. Normal mood and affect.  Impression/Plan: Katherine Montgomery is here for a colonoscopy to be performed for adenoma polyps in 2019  Risks, benefits, limitations, and alternatives regarding  colonoscopy have been reviewed with the patient.  Questions have been answered.  All parties agreeable.   Virgel Manifold, MD  01/10/2021, 9:05 AM

## 2021-01-10 NOTE — Op Note (Signed)
[facility] Gastroenterology Patient Name: Katherine Montgomery Procedure Date: 01/10/2021 8:53 AM MRN: [id number] Account #: [id number] Date of Birth: 30-Dec-1965 Admit Type: Outpatient Age: 55 Room: [facility] ENDO ROOM 3 Gender: Female Note Status: Finalized Instrument Name: Jasper Riling [id number] Procedure:             Colonoscopy Indications:           High risk colon cancer surveillance: Personal history                         of colonic polyps Providers:             Mayeli Bornhorst B. Bonna Gains MD, MD Referring MD:          Lenard Simmer, MD (Referring MD) Medicines:             Monitored Anesthesia Care Complications:         No immediate complications. Procedure:             Pre-Anesthesia Assessment:                        - ASA Grade Assessment: II - A patient with mild                         systemic disease.                        - Prior to the procedure, a History and Physical was                         performed, and patient medications, allergies and                         sensitivities were reviewed. The patient's tolerance                         of previous anesthesia was reviewed.                        - The risks and benefits of the procedure and the                         sedation options and risks were discussed with the                         patient. All questions were answered and informed                         consent was obtained.                        - Patient identification and proposed procedure were                         verified prior to the procedure by the physician, the                         nurse, the anesthesiologist, the anesthetist and the  technician. The procedure was verified in the                         procedure room.                        After obtaining informed consent, the colonoscope was                         passed under direct vision. Throughout the procedure,                         the  patient's blood pressure, pulse, and oxygen                         saturations were monitored continuously. The                         Colonoscope was introduced through the anus and                         advanced to the the cecum, identified by appendiceal                         orifice and ileocecal valve. The colonoscopy was                         performed with ease. The patient tolerated the                         procedure well. The quality of the bowel preparation                         was good. Findings:      The perianal and digital rectal examinations were normal.      A 7 mm polyp was found in the cecum. The polyp was sessile. The polyp       was removed with a cold snare. Resection and retrieval were complete.      A 4 mm polyp was found in the sigmoid colon. The polyp was flat. The       polyp was removed with a cold biopsy forceps. Resection and retrieval       were complete.      The exam was otherwise without abnormality.      The rectum, sigmoid colon, descending colon, transverse colon, ascending       colon and cecum appeared normal.      Anal papilla(e) were hypertrophied.      The retroflexed view of the distal rectum and anal verge was normal and       showed no anal or rectal abnormalities. Impression:            - One 7 mm polyp in the cecum, removed with a cold                         snare. Resected and retrieved.                        - One 4 mm polyp in the sigmoid colon, removed with a  cold biopsy forceps. Resected and retrieved.                        - The examination was otherwise normal.                        - The rectum, sigmoid colon, descending colon,                         transverse colon, ascending colon and cecum are normal.                        - Anal papilla(e) were hypertrophied.                        - The distal rectum and anal verge are normal on                         retroflexion  view. Recommendation:        - Await pathology results.                        - Refer to a colo-rectal surgeon for evaluation of                         large hypertrophied anal papillae.                        - Discharge patient to home (with escort).                        - Advance diet as tolerated.                        - Continue present medications.                        - Repeat colonoscopy date to be determined after                         pending pathology results are reviewed.                        - The findings and recommendations were discussed with                         the patient.                        - The findings and recommendations were discussed with                         the patient's family.                        - Return to primary care physician as previously                         scheduled. Procedure Code(s):     --- Professional ---  45385, Colonoscopy, flexible; with removal of                         tumor(s), polyp(s), or other lesion(s) by snare                         technique                        45380, 69, Colonoscopy, flexible; with biopsy, single                         or multiple Diagnosis Code(s):     --- Professional ---                        Z86.010, Personal history of colonic polyps                        K63.5, Polyp of colon CPT copyright 2019 American Medical Association. All rights reserved. The codes documented in this report are preliminary and upon coder review may  be revised to meet current compliance requirements.  Vonda Antigua, MD Margretta Sidle B. Bonna Gains MD, MD 01/10/2021 10:08:55 AM This report has been signed electronically. Number of Addenda: 0 Note Initiated On: 01/10/2021 8:53 AM Scope Withdrawal Time: 0 hours 27 minutes 11 seconds  Total Procedure Duration: 0 hours 43 minutes 46 seconds  Estimated Blood Loss:  Estimated blood loss: none.      [facility]

## 2021-01-10 NOTE — Anesthesia Preprocedure Evaluation (Addendum)
Anesthesia Evaluation  Patient identified by MRN, date of birth, ID band Patient awake    Reviewed: Allergy & Precautions, NPO status , Patient's Chart, lab work & pertinent test results  History of Anesthesia Complications Negative for: history of anesthetic complications  Airway Mallampati: II  TM Distance: >3 FB Neck ROM: Full    Dental no notable dental hx. (+) Teeth Intact   Pulmonary neg pulmonary ROS, neg sleep apnea, neg COPD, Patient abstained from smoking.Not current smoker,    Pulmonary exam normal breath sounds clear to auscultation       Cardiovascular Exercise Tolerance: Good METS(-) hypertension(-) CAD and (-) Past MI (-) dysrhythmias + Valvular Problems/Murmurs MVP  Rhythm:Regular Rate:Normal - Systolic murmurs    Neuro/Psych  Headaches, negative psych ROS   GI/Hepatic neg GERD  ,(+)     (-) substance abuse  ,   Endo/Other  neg diabetesHypothyroidism   Renal/GU negative Renal ROS     Musculoskeletal   Abdominal   Peds  Hematology   Anesthesia Other Findings Past Medical History: No date: Family history of breast cancer     Comment:  mother age 56 - pt's lifetime risk is 27.3% No date: Goiter No date: Hyperlipidemia No date: Hypothyroid No date: Ocular migraine  Reproductive/Obstetrics                            Anesthesia Physical  Anesthesia Plan  ASA: 2  Anesthesia Plan: General   Post-op Pain Management:    Induction: Intravenous  PONV Risk Score and Plan: 3 and Propofol infusion and Treatment may vary due to age or medical condition  Airway Management Planned: Natural Airway and Nasal Cannula  Additional Equipment: None  Intra-op Plan:   Post-operative Plan:   Informed Consent: I have reviewed the patients History and Physical, chart, labs and discussed the procedure including the risks, benefits and alternatives for the proposed anesthesia with the  patient or authorized representative who has indicated his/her understanding and acceptance.     Dental advisory given  Plan Discussed with: CRNA and Surgeon  Anesthesia Plan Comments:         Anesthesia Quick Evaluation

## 2021-01-10 NOTE — Anesthesia Postprocedure Evaluation (Signed)
Anesthesia Post Note  Patient: Katherine Montgomery  Procedure(s) Performed: COLONOSCOPY WITH PROPOFOL  Patient location during evaluation: Endoscopy Anesthesia Type: General Level of consciousness: awake and alert Pain management: pain level controlled Vital Signs Assessment: post-procedure vital signs reviewed and stable Respiratory status: spontaneous breathing, nonlabored ventilation and respiratory function stable Cardiovascular status: blood pressure returned to baseline and stable Postop Assessment: no apparent nausea or vomiting Anesthetic complications: no   No notable events documented.   Last Vitals:  Vitals:   01/10/21 1009 01/10/21 1019  BP: 107/65 106/63  Pulse: 69 73  Resp: 15 19  Temp:    SpO2: 100% 100%    Last Pain:  Vitals:   01/10/21 1019  TempSrc:   PainSc: 0-No pain                 [place] Ouch

## 2021-01-11 ENCOUNTER — Encounter: Payer: Self-pay | Admitting: Gastroenterology

## 2021-01-11 LAB — SURGICAL PATHOLOGY

## 2021-01-17 ENCOUNTER — Telehealth: Payer: Self-pay

## 2021-01-17 NOTE — Telephone Encounter (Signed)
Referral with notes, demographics, insurance card etc has already been faxed 01/15/2021

## 2021-01-17 NOTE — Telephone Encounter (Signed)
-----   Message from Virgel Manifold, MD sent at 01/10/2021  3:17 PM EST ----- Please refer pt to [place] [facility] for "large hypertrophied anal papillae". I already spoke to pt about referral and she is agreeable

## 2021-01-23 ENCOUNTER — Encounter: Payer: Self-pay | Admitting: Gastroenterology

## 2021-03-21 ENCOUNTER — Ambulatory Visit: Payer: BC Managed Care – PPO | Admitting: Dermatology

## 2021-04-18 ENCOUNTER — Other Ambulatory Visit: Payer: Self-pay

## 2021-04-18 ENCOUNTER — Ambulatory Visit: Payer: BC Managed Care – PPO | Admitting: Dermatology

## 2021-04-18 DIAGNOSIS — B353 Tinea pedis: Secondary | ICD-10-CM

## 2021-04-18 DIAGNOSIS — L918 Other hypertrophic disorders of the skin: Secondary | ICD-10-CM

## 2021-04-18 DIAGNOSIS — L820 Inflamed seborrheic keratosis: Secondary | ICD-10-CM

## 2021-04-18 DIAGNOSIS — L821 Other seborrheic keratosis: Secondary | ICD-10-CM

## 2021-04-18 DIAGNOSIS — L219 Seborrheic dermatitis, unspecified: Secondary | ICD-10-CM | POA: Diagnosis not present

## 2021-04-18 DIAGNOSIS — I781 Nevus, non-neoplastic: Secondary | ICD-10-CM | POA: Diagnosis not present

## 2021-04-18 DIAGNOSIS — Z1283 Encounter for screening for malignant neoplasm of skin: Secondary | ICD-10-CM

## 2021-04-18 DIAGNOSIS — Z86018 Personal history of other benign neoplasm: Secondary | ICD-10-CM

## 2021-04-18 DIAGNOSIS — L578 Other skin changes due to chronic exposure to nonionizing radiation: Secondary | ICD-10-CM

## 2021-04-18 DIAGNOSIS — L814 Other melanin hyperpigmentation: Secondary | ICD-10-CM

## 2021-04-18 DIAGNOSIS — D1800 Hemangioma unspecified site: Secondary | ICD-10-CM

## 2021-04-18 DIAGNOSIS — D229 Melanocytic nevi, unspecified: Secondary | ICD-10-CM

## 2021-04-18 MED ORDER — CLOBETASOL PROPIONATE 0.05 % EX SOLN: 1.0000 "application " | CUTANEOUS | 2 refills | Status: DC

## 2021-04-18 NOTE — Progress Notes (Signed)
? ?Follow-Up Visit ?  ?Subjective  ?Katherine Montgomery is a 56 y.o. female who presents for the following: Follow-up (Patient here today for tbse. Patient has history of dysplastic at left lateral dorsal foot and history of seb derm at scalp. ). ?The patient presents for Total-Body Skin Exam (TBSE) for skin cancer screening and mole check.  The patient has spots, moles and lesions to be evaluated, some may be new or changing and the patient has concerns that these could be cancer. ? ?The following portions of the chart were reviewed this encounter and updated as appropriate:  Tobacco  Allergies  Meds  Problems  Med Hx  Surg Hx  Fam Hx   ?  ?Review of Systems: No other skin or systemic complaints except as noted in HPI or Assessment and Plan. ? ?Objective  ?Well appearing patient in no apparent distress; mood and affect are within normal limits. ? ?A full examination was performed including scalp, head, eyes, ears, nose, lips, neck, chest, axillae, abdomen, back, buttocks, bilateral upper extremities, bilateral lower extremities, hands, feet, fingers, toes, fingernails, and toenails. All findings within normal limits unless otherwise noted below. ? ?Scalp ?Minimal scalp at postauricular  ? ?Right Dorsal Hand x 1 ?Erythematous stuck-on, waxy papule or plaque ? ? ? ? ? ? ?left temple ?Dilated blood vessel ? ? ? ? ? ? ? ? ? ?Assessment & Plan  ?Seborrheic dermatitis /SEBO psoriasis ?Scalp ? ?Seborrheic Dermatitis  ?-  is a chronic persistent rash characterized by pinkness and scaling most commonly of the mid face but also can occur on the scalp (dandruff), ears; mid chest and mid back. ?It tends to be exacerbated by stress and cooler weather.  People who have neurologic disease may experience new onset or exacerbation of existing seborrheic dermatitis.  The condition is not curable but treatable and can be controlled. ? ?Patient d/c ketoconazole 2 % shampoo at this time ?Cont Clobetasol sol qd up to 3d/wk  aa scalp,  avoid f/g/a can use up to 5 days a week  ?  ?Topical steroids (such as triamcinolone, fluocinolone, fluocinonide, mometasone, clobetasol, halobetasol, betamethasone, hydrocortisone) can cause thinning and lightening of the skin if they are used for too long in the same area. Your physician has selected the right strength medicine for your problem and area affected on the body. Please use your medication only as directed by your physician to prevent side effects.   ?  ?clobetasol (TEMOVATE) 0.05 % external solution - Scalp ?Apply 1 application. topically as directed. Apply to aa scalp can use up to  5 days weekly, avoid face, groin, axilla ? ?Related Medications ?ketoconazole (NIZORAL) 2 % shampoo ?Apply 1 application topically 2 (two) times a week. Let sit 5 minutes and rinse out ? ?Tinea pedis of both feet ?Chronic and persistent. ?Ketoconazole 2% cream nightly. ? ?Related Medications ?ketoconazole (NIZORAL) 2 % cream ?Apply 1 application topically daily. Qhs to feet, in between toes and on sides of feet ? ?terbinafine (LAMISIL) 250 MG tablet ?Take 1 tablet (250 mg total) by mouth daily. ? ?Inflamed seborrheic keratosis ?Right Dorsal Hand x 1 ?Isk vs wart  ?Recheck at follow up ?Photo today  ?Destruction of lesion - Right Dorsal Hand x 1 ?Complexity: simple   ?Destruction method: cryotherapy   ?Informed consent: discussed and consent obtained   ?Timeout:  patient name, date of birth, surgical site, and procedure verified ?Lesion destroyed using liquid nitrogen: Yes   ?Region frozen until ice ball extended beyond lesion: Yes   ?  Outcome: patient tolerated procedure well with no complications   ?Post-procedure details: wound care instructions given   ?Additional details:  Prior to procedure, discussed risks of blister formation, small wound, skin dyspigmentation, or rare scar following cryotherapy. Recommend Vaseline ointment to treated areas while healing. ? ?Telangiectasia ?left temple ?Benign appearing on  exam ?Discussed the treatment option of BBL/laser.  Typically we recommend 1-3 treatment sessions about 5-8 weeks apart for best results.  The patient's condition may require "maintenance treatments" in the future.  The fee for BBL / laser treatments is $350 per treatment session for the whole face.  A fee can be quoted for other parts of the body. ?Insurance typically does not pay for BBL/laser treatments and therefore the fee is an out-of-pocket cost. ? ?Lentigines ?- Scattered tan macules ?- Due to sun exposure ?- Benign-appearing, observe ?- Recommend daily broad spectrum sunscreen SPF 30+ to sun-exposed areas, reapply every 2 hours as needed. ?- Call for any changes ? ?Acrochordons (Skin Tags) ?- Fleshy, skin-colored pedunculated papules ?- Benign appearing.  ?- Observe. ?- If desired, they can be removed with an in office procedure that is not covered by insurance. ?- Please call the clinic if you notice any new or changing lesions. ? ?Seborrheic Keratoses ?- Stuck-on, waxy, tan-brown papules and/or plaques  ?- Benign-appearing ?- Discussed benign etiology and prognosis. ?- Observe ?- Call for any changes ? ?Melanocytic Nevi ?- Tan-brown and/or pink-flesh-colored symmetric macules and papules ?- Benign appearing on exam today ?- Observation ?- Call clinic for new or changing moles ?- Recommend daily use of broad spectrum spf 30+ sunscreen to sun-exposed areas.  ? ?Hemangiomas ?- Red papules ?- Discussed benign nature ?- Observe ?- Call for any changes ? ?Actinic Damage ?- Chronic condition, secondary to cumulative UV/sun exposure ?- diffuse scaly erythematous macules with underlying dyspigmentation ?- Recommend daily broad spectrum sunscreen SPF 30+ to sun-exposed areas, reapply every 2 hours as needed.  ?- Staying in the shade or wearing long sleeves, sun glasses (UVA+UVB protection) and wide brim hats (4-inch brim around the entire circumference of the hat) are also recommended for sun protection.  ?- Call  for new or changing lesions. ? ?History of Dysplastic Nevi ?- No evidence of recurrence today left lateral dorsal foot  ?- Recommend regular full body skin exams ?- Recommend daily broad spectrum sunscreen SPF 30+ to sun-exposed areas, reapply every 2 hours as needed.  ?- Call if any new or changing lesions are noted between office visits ? ?Skin cancer screening performed today. ? ?Return for 1 year tbse . ?Garry Heater, CMA, am acting as scribe for Sarina Ser, MD. ?Documentation: I have reviewed the above documentation for accuracy and completeness, and I agree with the above. ? ?Sarina Ser, MD ? ?

## 2021-04-18 NOTE — Patient Instructions (Signed)
? ?Topical steroids (such as triamcinolone, fluocinolone, fluocinonide, mometasone, clobetasol, halobetasol, betamethasone, hydrocortisone) can cause thinning and lightening of the skin if they are used for too long in the same area. Your physician has selected the right strength medicine for your problem and area affected on the body. Please use your medication only as directed by your physician to prevent side effects.  ? ? ? ? ? ?Melanoma ABCDEs ? ?Melanoma is the most dangerous type of skin cancer, and is the leading cause of death from skin disease.  You are more likely to develop melanoma if you: ?Have light-colored skin, light-colored eyes, or red or blond hair ?Spend a lot of time in the sun ?Tan regularly, either outdoors or in a tanning bed ?Have had blistering sunburns, especially during childhood ?Have a close family member who has had a melanoma ?Have atypical moles or large birthmarks ? ?Early detection of melanoma is key since treatment is typically straightforward and cure rates are extremely high if we catch it early.  ? ?The first sign of melanoma is often a change in a mole or a new dark spot.  The ABCDE system is a way of remembering the signs of melanoma. ? ?A for asymmetry:  The two halves do not match. ?B for border:  The edges of the growth are irregular. ?C for color:  A mixture of colors are present instead of an even brown color. ?D for diameter:  Melanomas are usually (but not always) greater than 69m - the size of a pencil eraser. ?E for evolution:  The spot keeps changing in size, shape, and color. ? ?Please check your skin once per month between visits. You can use a small mirror in front and a large mirror behind you to keep an eye on the back side or your body.  ? ?If you see any new or changing lesions before your next follow-up, please call to schedule a visit. ? ?Please continue daily skin protection including broad spectrum sunscreen SPF 30+ to sun-exposed areas, reapplying  every 2 hours as needed when you're outdoors.  ? ?Staying in the shade or wearing long sleeves, sun glasses (UVA+UVB protection) and wide brim hats (4-inch brim around the entire circumference of the hat) are also recommended for sun protection.   ? ?If You Need Anything After Your Visit ? ?If you have any questions or concerns for your doctor, please call our main line at [phone] press option 4 to reach your doctor's medical assistant. If no one answers, please leave a voicemail as directed and we will return your call as soon as possible. Messages left after 4 pm will be answered the following business day.  ? ?You may also send [place] message via MyChart. We typically respond to MyChart messages within 1-2 business days. ? ?For prescription refills, please ask your pharmacy to contact our office. Our fax number is [phone] ? ?If you have an urgent issue when the clinic is closed that cannot wait until the next business day, you can page your doctor at the number below.   ? ?Please note that while we do our best to be available for urgent issues outside of office hours, we are not available 24/7.  ? ?If you have an urgent issue and are unable to reach [place] you may choose to seek medical care at your doctor's office, retail clinic, urgent care center, or emergency room. ? ?If you have a medical emergency, please immediately call 911 or go to  the emergency department. ? ?Pager Numbers ? ?- Dr. Nehemiah Massed: [phone] ? ?- Dr. Laurence Ferrari: [phone] ? ?- Dr. Nicole Kindred: [phone] ? ?In the event of inclement weather, please call our main line at [phone] for an update on the status of any delays or closures. ? ?Dermatology Medication Tips: ?Please keep the boxes that topical medications come in in order to help keep track of the instructions about where and how to use these. Pharmacies typically print the medication instructions only on the boxes and not directly on the medication tubes.  ? ?If your  medication is too expensive, please contact our office at [phone] option 4 or send [place] a message through [place].  ? ?We are unable to tell what your co-pay for medications will be in advance as this is different depending on your insurance coverage. However, we may be able to find a substitute medication at lower cost or fill out paperwork to get insurance to cover a needed medication.  ? ?If a prior authorization is required to get your medication covered by your insurance company, please allow [place] 1-2 business days to complete this process. ? ?Drug prices often vary depending on where the prescription is filled and some pharmacies may offer cheaper prices. ? ?The website www.goodrx.com contains coupons for medications through different pharmacies. The prices here do not account for what the cost may be with help from insurance (it may be cheaper with your insurance), but the website can give you the price if you did not use any insurance.  ?- You can print the associated coupon and take it with your prescription to the pharmacy.  ?- You may also stop by our office during regular business hours and pick up a GoodRx coupon card.  ?- If you need your prescription sent electronically to a different pharmacy, notify our office through [facility] or by phone at [phone] option 4. ? ? ? ? ?Si Usted Necesita Algo Despu?s de Su Visita ? ?Tambi?n puede enviarnos un mensaje a trav?s de MyChart. Por lo general respondemos a los mensajes de MyChart en el transcurso de 1 a 2 d?as h?biles. ? ?Para renovar recetas, por favor pida a su farmacia que se ponga en contacto con nuestra oficina. Nuestro n?mero de fax es el [phone]. ? ?Si tiene un asunto urgente cuando la cl?nica est? cerrada y que no puede esperar hasta el siguiente d?a h?bil, puede llamar/localizar a su doctor(a) al n?mero que aparece a continuaci?n.  ? ?Por favor, tenga en cuenta que aunque hacemos todo lo posible para estar disponibles para  asuntos urgentes fuera del horario de oficina, no estamos disponibles las 24 horas del d?a, los 7 d?as de la semana.  ? ?Si tiene un problema urgente y no puede comunicarse con nosotros, puede optar por buscar atenci?n m?dica  en el consultorio de su doctor(a), en una cl?nica privada, en un centro de atenci?n urgente o en una sala de emergencias. ? ?Si tiene Engineer, maintenance (IT) m?dica, por favor llame inmediatamente al 911 o vaya a la sala de emergencias. ? ?N?meros de b?per ? ?- Dr. Nehemiah Massed: [phone] ? ?- Dra. Moye: [phone] ? ?- Dra. Nicole Kindred: [phone] ? ?En caso de inclemencias del tiempo, por favor llame a nuestra l?nea principal al [phone] para una actualizaci?n sobre el estado de cualquier retraso o cierre. ? ?Consejos para la medicaci?n en dermatolog?a: ?Por favor, guarde las cajas en las que vienen los medicamentos de uso t?pico para ayudarle a seguir las instrucciones sobre d?nde y c?mo usarlos.  Las farmacias generalmente imprimen las instrucciones del medicamento s?lo en las cajas y no directamente en los tubos del [place].  ? ?Si su medicamento es muy caro, por favor, p?ngase en contacto con Zigmund Daniel llamando al [phone] y presione la opci?n 4 o env?enos un mensaje a trav?s de MyChart.  ? ?No podemos decirle cu?l ser? su copago por los medicamentos por adelantado ya que esto es diferente dependiendo de la cobertura de su seguro. Sin embargo, es posible que podamos encontrar un medicamento sustituto a Electrical engineer un formulario para que el seguro cubra el medicamento que se considera necesario.  ? ?Si se requiere Ardelia Mems autorizaci?n previa para que su compa??a de seguros [place] su medicamento, por favor perm?tanos de 1 a 2 d?as h?biles para completar este proceso. ? ?Los precios de los medicamentos var?an con frecuencia dependiendo del Environmental consultant de d?nde se surte la receta y alguna farmacias pueden ofrecer precios m?s baratos. ? ?El sitio web www.goodrx.com tiene cupones para  medicamentos de Airline pilot. Los precios aqu? no tienen en cuenta lo que podr?a costar con la ayuda del seguro (puede ser m?s barato con su seguro), pero el sitio web puede darle el precio si no utiliz? ning?n

## 2021-04-22 ENCOUNTER — Encounter: Payer: Self-pay | Admitting: Dermatology

## 2021-08-26 ENCOUNTER — Other Ambulatory Visit: Payer: Self-pay | Admitting: Endocrinology

## 2021-08-26 DIAGNOSIS — Z1231 Encounter for screening mammogram for malignant neoplasm of breast: Secondary | ICD-10-CM

## 2021-09-17 ENCOUNTER — Ambulatory Visit
Admission: RE | Admit: 2021-09-17 | Discharge: 2021-09-17 | Disposition: A | Discharge status: Home / Self Care | Payer: BC Managed Care – PPO | Source: Ambulatory Visit | Attending: Endocrinology | Admitting: Endocrinology

## 2021-09-17 DIAGNOSIS — Z1231 Encounter for screening mammogram for malignant neoplasm of breast: Secondary | ICD-10-CM | POA: Insufficient documentation

## 2022-04-21 ENCOUNTER — Ambulatory Visit: Payer: BC Managed Care – PPO | Admitting: Dermatology

## 2022-04-23 ENCOUNTER — Ambulatory Visit: Payer: BC Managed Care – PPO | Admitting: Dermatology

## 2022-04-23 ENCOUNTER — Encounter: Payer: Self-pay | Admitting: Dermatology

## 2022-04-23 VITALS — BP 104/63 | HR 71

## 2022-04-23 DIAGNOSIS — D1801 Hemangioma of skin and subcutaneous tissue: Secondary | ICD-10-CM

## 2022-04-23 DIAGNOSIS — L814 Other melanin hyperpigmentation: Secondary | ICD-10-CM | POA: Diagnosis not present

## 2022-04-23 DIAGNOSIS — Z7189 Other specified counseling: Secondary | ICD-10-CM

## 2022-04-23 DIAGNOSIS — L729 Follicular cyst of the skin and subcutaneous tissue, unspecified: Secondary | ICD-10-CM

## 2022-04-23 DIAGNOSIS — Z86018 Personal history of other benign neoplasm: Secondary | ICD-10-CM

## 2022-04-23 DIAGNOSIS — I781 Nevus, non-neoplastic: Secondary | ICD-10-CM

## 2022-04-23 DIAGNOSIS — L578 Other skin changes due to chronic exposure to nonionizing radiation: Secondary | ICD-10-CM

## 2022-04-23 DIAGNOSIS — Z1283 Encounter for screening for malignant neoplasm of skin: Secondary | ICD-10-CM | POA: Diagnosis not present

## 2022-04-23 DIAGNOSIS — D229 Melanocytic nevi, unspecified: Secondary | ICD-10-CM

## 2022-04-23 DIAGNOSIS — L820 Inflamed seborrheic keratosis: Secondary | ICD-10-CM

## 2022-04-23 DIAGNOSIS — L821 Other seborrheic keratosis: Secondary | ICD-10-CM

## 2022-04-23 NOTE — Progress Notes (Signed)
Follow-Up Visit   Subjective  Katherine Montgomery is a 57 y.o. female who presents for the following: Skin Cancer Screening and Full Body Skin Exam. 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 The patient presents for Total-Body Skin Exam (TBSE) for skin cancer screening and mole check. The patient has spots, moles and lesions to be evaluated, some may be new or changing and the patient has concerns that these could be cancer.  The following portions of the chart were reviewed this encounter and updated as appropriate: medications, allergies, medical history  Review of Systems:  No other skin or systemic complaints except as noted in HPI or Assessment and Plan.  Objective  Well appearing patient in no apparent distress; mood and affect are within normal limits.  A full examination was performed including scalp, head, eyes, ears, nose, lips, neck, chest, axillae, abdomen, back, buttocks, bilateral upper extremities, bilateral lower extremities, hands, feet, fingers, toes, fingernails, and toenails. All findings within normal limits unless otherwise noted below.   Relevant physical exam findings are noted in the Assessment and Plan.   Assessment & Plan   LENTIGINES, SEBORRHEIC KERATOSES, HEMANGIOMAS - Benign normal skin lesions - Benign-appearing - Call for any changes  MELANOCYTIC NEVI - Tan-brown and/or pink-flesh-colored symmetric macules and papules - Benign appearing on exam today - Observation - Call clinic for new or changing moles - Recommend daily use of broad spectrum spf 30+ sunscreen to sun-exposed areas.   ACTINIC DAMAGE - Chronic condition, secondary to cumulative UV/sun exposure - diffuse scaly erythematous macules with underlying dyspigmentation - Recommend daily broad spectrum sunscreen SPF 30+ to sun-exposed areas, reapply every 2 hours as needed.  - Staying in the shade or wearing long sleeves, sun glasses (UVA+UVB protection) and wide brim hats (4-inch brim around the  entire circumference of the hat) are also recommended for sun protection.  - Call for new or changing lesions.  EPIDERMAL INCLUSION CYST Exam: Subcutaneous nodules at right mons pubics and left mons pubis  Benign-appearing. Exam most consistent with an epidermal inclusion cyst. Discussed that a cyst is a benign growth that can grow over time and sometimes get irritated or inflamed. Recommend observation if it is not bothersome. Discussed option of surgical excision to remove it if it is growing, symptomatic, or other changes noted. Please call for new or changing lesions so they can be evaluated.  Schedule surgery- 2 different surgery appointments  WART vs ISK  Exam: verrucous papule(s) Location: right dorsum hand   Discussed viral / HPV (Human Papilloma Virus) etiology and risk of spread /infectivity to other areas of body as well as to other people.  Multiple treatments and methods may be required to clear warts and it is possible treatment may not be successful.  Treatment risks include discoloration; scarring and there is still potential for wart recurrence.  Treatment Plan: Destruction Procedure Note Destruction method: cryotherapy   Informed consent: discussed and consent obtained   Lesion destroyed using liquid nitrogen: Yes   Outcome: patient tolerated procedure well with no complications   Post-procedure details: wound care instructions given   Locations: right dorsum hand  # of Lesions Treated: 1  Prior to procedure, discussed risks of blister formation, small wound, skin dyspigmentation, or rare scar following cryotherapy. Recommend Vaseline ointment to treated areas while healing.    Telangiectasia Exam :Dilated blood vessel Location: Left forehead above lateral brow Counseling for BBL / IPL / Laser and Coordination of Care Discussed the treatment option of Broad Band Light (  BBL) Daron Offer Pulsed Light (IPL)/ Laser for skin discoloration, including brown spots and redness.   Typically we recommend at least 1-3 treatment sessions about 5-8 weeks apart for best results.  Cannot have tanned skin when BBL performed, and regular use of sunscreen is advised after the procedure to help maintain results. The patient's condition may also require "maintenance treatments" in the future.  The fee for BBL / laser treatments is $200 per treatment session for the whole face.  A fee can be quoted for other parts of the body.  Insurance typically does not pay for BBL/laser treatments and therefore the fee is an out-of-pocket cost.    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  - Call if any new or changing lesions are noted between office visits   SKIN CANCER SCREENING PERFORMED TODAY.   Return in about 1 year (around 04/23/2023) for TBSE, hx of Atypical nevus .  IMarye Round, CMA, am acting as scribe for Sarina Ser, MD .   Documentation: I have reviewed the above documentation for accuracy and completeness, and I agree with the above.  Sarina Ser, MD

## 2022-04-23 NOTE — Patient Instructions (Signed)
Due to recent changes in healthcare laws, you may see results of your pathology and/or laboratory studies on MyChart before the doctors have had a chance to review them. We understand that in some cases there may be results that are confusing or concerning to you. Please understand that not all results are received at the same time and often the doctors may need to interpret multiple results in order to provide you with the best plan of care or course of treatment. Therefore, we ask that you please give us 2 business days to thoroughly review all your results before contacting the office for clarification. Should we see a critical lab result, you will be contacted sooner.   If You Need Anything After Your Visit  If you have any questions or concerns for your doctor, please call our main line at [phone] and press option 4 to reach your doctor's medical assistant. If no one answers, please leave a voicemail as directed and we will return your call as soon as possible. Messages left after 4 pm will be answered the following business day.   You may also send us a message via MyChart. We typically respond to MyChart messages within 1-2 business days.  For prescription refills, please ask your pharmacy to contact our office. Our fax number is [phone].  If you have an urgent issue when the clinic is closed that cannot wait until the next business day, you can page your doctor at the number below.    Please note that while we do our best to be available for urgent issues outside of office hours, we are not available 24/7.   If you have an urgent issue and are unable to reach us, you may choose to seek medical care at your doctor's office, retail clinic, urgent care center, or emergency room.  If you have a medical emergency, please immediately call 911 or go to the emergency department.  Pager Numbers  - Dr. Kowalski: [phone]  - Dr. Moye: [phone]  - Dr. Stewart:  [phone]  In the event of inclement weather, please call our main line at [phone] for an update on the status of any delays or closures.  Dermatology Medication Tips: Please keep the boxes that topical medications come in in order to help keep track of the instructions about where and how to use these. Pharmacies typically print the medication instructions only on the boxes and not directly on the medication tubes.   If your medication is too expensive, please contact our office at [phone] option 4 or send us a message through MyChart.   We are unable to tell what your co-pay for medications will be in advance as this is different depending on your insurance coverage. However, we may be able to find a substitute medication at lower cost or fill out paperwork to get insurance to cover a needed medication.   If a prior authorization is required to get your medication covered by your insurance company, please allow us 1-2 business days to complete this process.  Drug prices often vary depending on where the prescription is filled and some pharmacies may offer cheaper prices.  The website www.goodrx.com contains coupons for medications through different pharmacies. The prices here do not account for what the cost may be with help from insurance (it may be cheaper with your insurance), but the website can give you the price if you did not use any insurance.  - You can print the associated coupon and take it with   your prescription to the pharmacy.  - You may also stop by our office during regular business hours and pick up a GoodRx coupon card.  - If you need your prescription sent electronically to a different pharmacy, notify our office through [facility] MyChart or by phone at [phone] option 4.     Si Usted Necesita Algo Despus de Su Visita  Tambin puede enviarnos un mensaje a travs de MyChart. Por lo general respondemos a los mensajes de MyChart en el transcurso de 1 a 2  das hbiles.  Para renovar recetas, por favor pida a su farmacia que se ponga en contacto con nuestra oficina. Nuestro nmero de fax es el [phone].  Si tiene un asunto urgente cuando la clnica est cerrada y que no puede esperar hasta el siguiente da hbil, puede llamar/localizar a su doctor(a) al nmero que aparece a continuacin.   Por favor, tenga en cuenta que aunque hacemos todo lo posible para estar disponibles para asuntos urgentes fuera del horario de oficina, no estamos disponibles las 24 horas del da, los 7 das de la semana.   Si tiene un problema urgente y no puede comunicarse con nosotros, puede optar por buscar atencin mdica  en el consultorio de su doctor(a), en una clnica privada, en un centro de atencin urgente o en una sala de emergencias.  Si tiene una emergencia mdica, por favor llame inmediatamente al 911 o vaya a la sala de emergencias.  Nmeros de bper  - Dr. Kowalski: [phone]  - Dra. Moye: [phone]  - Dra. Stewart: [phone]  En caso de inclemencias del tiempo, por favor llame a nuestra lnea principal al [phone] para una actualizacin sobre el estado de cualquier retraso o cierre.  Consejos para la medicacin en dermatologa: Por favor, guarde las cajas en las que vienen los medicamentos de uso tpico para ayudarle a seguir las instrucciones sobre dnde y cmo usarlos. Las farmacias generalmente imprimen las instrucciones del medicamento slo en las cajas y no directamente en los tubos del medicamento.   Si su medicamento es muy caro, por favor, pngase en contacto con nuestra oficina llamando al [phone] y presione la opcin 4 o envenos un mensaje a travs de MyChart.   No podemos decirle cul ser su copago por los medicamentos por adelantado ya que esto es diferente dependiendo de la cobertura de su seguro. Sin embargo, es posible que podamos encontrar un medicamento sustituto a menor costo o llenar un formulario para que el  seguro cubra el medicamento que se considera necesario.   Si se requiere una autorizacin previa para que su compaa de seguros cubra su medicamento, por favor permtanos de 1 a 2 das hbiles para completar este proceso.  Los precios de los medicamentos varan con frecuencia dependiendo del lugar de dnde se surte la receta y alguna farmacias pueden ofrecer precios ms baratos.  El sitio web www.goodrx.com tiene cupones para medicamentos de diferentes farmacias. Los precios aqu no tienen en cuenta lo que podra costar con la ayuda del seguro (puede ser ms barato con su seguro), pero el sitio web puede darle el precio si no utiliz ningn seguro.  - Puede imprimir el cupn correspondiente y llevarlo con su receta a la farmacia.  - Tambin puede pasar por nuestra oficina durante el horario de atencin regular y recoger una tarjeta de cupones de GoodRx.  - Si necesita que su receta se enve electrnicamente a una farmacia diferente, informe a nuestra oficina a travs de MyChart de [place]   o por telfono llamando al [phone] y presione la opcin 4.  

## 2022-05-20 ENCOUNTER — Ambulatory Visit (INDEPENDENT_AMBULATORY_CARE_PROVIDER_SITE_OTHER): Payer: BC Managed Care – PPO | Admitting: Dermatology

## 2022-05-20 ENCOUNTER — Telehealth: Payer: Self-pay

## 2022-05-20 VITALS — BP 100/64

## 2022-05-20 DIAGNOSIS — Z79899 Other long term (current) drug therapy: Secondary | ICD-10-CM

## 2022-05-20 DIAGNOSIS — L720 Epidermal cyst: Secondary | ICD-10-CM | POA: Diagnosis not present

## 2022-05-20 DIAGNOSIS — L409 Psoriasis, unspecified: Secondary | ICD-10-CM

## 2022-05-20 DIAGNOSIS — L408 Other psoriasis: Secondary | ICD-10-CM

## 2022-05-20 DIAGNOSIS — L219 Seborrheic dermatitis, unspecified: Secondary | ICD-10-CM | POA: Diagnosis not present

## 2022-05-20 DIAGNOSIS — D492 Neoplasm of unspecified behavior of bone, soft tissue, and skin: Secondary | ICD-10-CM

## 2022-05-20 MED ORDER — CLOBETASOL PROPIONATE 0.05 % EX SOLN: 1.0000 | CUTANEOUS | 1 refills | Status: DC

## 2022-05-20 MED ORDER — DOXYCYCLINE MONOHYDRATE 100 MG PO CAPS: 100.0000 mg | ORAL_CAPSULE | Freq: Two times a day (BID) | ORAL | 0 refills | Status: AC

## 2022-05-20 MED ORDER — MUPIROCIN 2 % EX OINT: 1.0000 | TOPICAL_OINTMENT | Freq: Every day | CUTANEOUS | 1 refills | Status: AC

## 2022-05-20 NOTE — Patient Instructions (Addendum)
Wound Care Instructions  On the day following your surgery, you should begin doing daily dressing changes: Remove the old dressing and discard it. Cleanse the wound gently with tap water. This may be done in the shower or by placing a wet gauze pad directly on the wound and letting it soak for several minutes. It is important to gently remove any dried blood from the wound in order to encourage healing. This may be done by gently rolling a moistened Q-tip on the dried blood. Do not pick at the wound. If the wound should start to bleed, continue cleaning the wound, then place a moist gauze pad on the wound and hold pressure for a few minutes.  Make sure you then dry the skin surrounding the wound completely or the tape will not stick to the skin. Do not use cotton balls on the wound. After the wound is clean and dry, apply the ointment gently with a Q-tip. Cut a non-stick pad to fit the size of the wound. Lay the pad flush to the wound. If the wound is draining, you may want to reinforce it with a small amount of gauze on top of the non-stick pad for a little added compression to the area. Use the tape to seal the area completely. Select from the following with respect to your individual situation: If your wound has been stitched closed: continue the above steps 1-8 at least daily until your sutures are removed. If your wound has been left open to heal: continue steps 1-8 at least daily for the first 3-4 weeks. We would like for you to take a few extra precautions for at least the next week. Sleep with your head elevated on pillows if our wound is on your head. Do not bend over or lift heavy items to reduce the chance of elevated blood pressure to the wound Do not participate in particularly strenuous activities.   Below is a list of dressing supplies you might need.  Cotton-tipped applicators - Q-tips Gauze pads (2x2 and/or 4x4) - All-Purpose Sponges Non-stick dressing material - Telfa Tape -  Paper or Hypafix New and clean tube of petroleum jelly - Vaseline    Comments on Post-Operative Period Slight swelling and redness often appear around the wound. This is normal and will disappear within several days following the surgery. The healing wound will drain a brownish-red-yellow discharge during healing. This is a normal phase of wound healing. As the wound begins to heal, the drainage may increase in amount. Again, this drainage is normal. Notify us if the drainage becomes persistently bloody, excessively swollen, or intensely painful or develops a foul odor or red streaks.  If you should experience mild discomfort during the healing phase, you may take an aspirin-free medication such as Tylenol (acetaminophen). Notify us if the discomfort is severe or persistent. Avoid alcoholic beverages when taking pain medicine.  In Case of Wound Hemorrhage A wound hemorrhage is when the bandage suddenly becomes soaked with bright red blood and flows profusely. If this happens, sit down or lie down with your head elevated. If the wound has a dressing on it, do not remove the dressing. Apply pressure to the existing gauze. If the wound is not covered, use a gauze pad to apply pressure and continue applying the pressure for 20 minutes without peeking. DO NOT COVER THE WOUND WITH A LARGE TOWEL OR WASH CLOTH. Release your hand from the wound site but do not remove the dressing. If the bleeding has stopped,   gently clean around the wound. Leave the dressing in place for 24 hours if possible. This wait time allows the blood vessels to close off so that you do not spark a new round of bleeding by disrupting the newly clotted blood vessels with an immediate dressing change. If the bleeding does not subside, continue to hold pressure. If matters are out of your control, contact an After Hours clinic or go to the Emergency Room.   Recommend DHS Sal, T-Sal shampoo for scalp  Doxycycline should be taken with food  to prevent nausea. Do not lay down for 30 minutes after taking. Be cautious with sun exposure and use good sun protection while on this medication. Pregnant women should not take this medication.    Due to recent changes in healthcare laws, you may see results of your pathology and/or laboratory studies on MyChart before the doctors have had a chance to review them. We understand that in some cases there may be results that are confusing or concerning to you. Please understand that not all results are received at the same time and often the doctors may need to interpret multiple results in order to provide you with the best plan of care or course of treatment. Therefore, we ask that you please give [place] 2 business days to thoroughly review all your results before contacting the office for clarification. Should we see a critical lab result, you will be contacted sooner.   If You Need Anything After Your Visit  If you have any questions or concerns for your doctor, please call our main line at [phone] and press option 4 to reach your doctor's medical assistant. If no one answers, please leave a voicemail as directed and we will return your call as soon as possible. Messages left after 4 pm will be answered the following business day.   You may also send [place] a message via MyChart. We typically respond to MyChart messages within 1-2 business days.  For prescription refills, please ask your pharmacy to contact our office. Our fax number is [phone].  If you have an urgent issue when the clinic is closed that cannot wait until the next business day, you can page your doctor at the number below.    Please note that while we do our best to be available for urgent issues outside of office hours, we are not available 24/7.   If you have an urgent issue and are unable to reach [place], you may choose to seek medical care at your doctor's office, retail clinic, urgent care center, or emergency room.  If you  have a medical emergency, please immediately call 911 or go to the emergency department.  Pager Numbers  - Dr. Gwen Pounds: [phone]  - Dr. Neale Burly: [phone]  - Dr. Roseanne Reno: [phone]  In the event of inclement weather, please call our main line at [phone] for an update on the status of any delays or closures.  Dermatology Medication Tips: Please keep the boxes that topical medications come in in order to help keep track of the instructions about where and how to use these. Pharmacies typically print the medication instructions only on the boxes and not directly on the medication tubes.   If your medication is too expensive, please contact our office at [phone] option 4 or send [place] a message through MyChart.   We are unable to tell what your co-pay for medications will be in advance as this is different depending on your insurance coverage. However, we may  be able to find a substitute medication at lower cost or fill out paperwork to get insurance to cover a needed medication.   If a prior authorization is required to get your medication covered by your insurance company, please allow [place] 1-2 business days to complete this process.  Drug prices often vary depending on where the prescription is filled and some pharmacies may offer cheaper prices.  The website www.goodrx.com contains coupons for medications through different pharmacies. The prices here do not account for what the cost may be with help from insurance (it may be cheaper with your insurance), but the website can give you the price if you did not use any insurance.  - You can print the associated coupon and take it with your prescription to the pharmacy.  - You may also stop by our office during regular business hours and pick up a GoodRx coupon card.  - If you need your prescription sent electronically to a different pharmacy, notify our office through [facility] or by phone at [phone] option  4.     Si Usted Necesita Algo Despus de Su Visita  Tambin puede enviarnos un mensaje a travs de Clinical cytogeneticist. Por lo general respondemos a los mensajes de MyChart en el transcurso de 1 a 2 das hbiles.  Para renovar recetas, por favor pida a su farmacia que se ponga en contacto con nuestra oficina. Annie Sable de fax es [place] [phone].  Si tiene un asunto urgente cuando la clnica est cerrada y que no puede esperar hasta el siguiente da hbil, puede llamar/localizar a su doctor(a) al nmero que aparece a continuacin.   Por favor, tenga en cuenta que aunque hacemos todo lo posible para estar disponibles para asuntos urgentes fuera del horario de [place], no estamos disponibles las 24 horas del da, los 7 [address]  [address] de la [place].   Si tiene un problema urgente y no puede comunicarse con nosotros, puede optar por buscar atencin mdica  en el consultorio de su doctor(a), en una clnica privada, en un centro de atencin urgente o en una sala de emergencias.  Si tiene Engineer, drilling, por favor llame inmediatamente al 911 o vaya a la sala de emergencias.  Nmeros de bper  - Dr. Gwen Pounds: [phone]  - Dra. Moye: [phone]  - Dra. Roseanne Reno: [phone]  En caso de inclemencias del [place], por favor llame a Lacy Duverney principal al [phone] para una actualizacin sobre el [place] de cualquier retraso o cierre.  Consejos para la medicacin en dermatologa: Por favor, guarde las cajas en las que vienen los medicamentos de uso tpico para ayudarle a seguir las instrucciones sobre dnde y cmo usarlos. Las farmacias generalmente imprimen las instrucciones del medicamento slo en las cajas y no directamente en los tubos del [place].   Si su medicamento es muy caro, por favor, pngase en contacto con Rolm Gala llamando al [phone] y presione la opcin 4 o envenos un mensaje a travs de Clinical cytogeneticist.   No podemos decirle cul ser su copago por los medicamentos por  adelantado ya que esto es diferente dependiendo de la cobertura de su seguro. Sin embargo, es posible que podamos encontrar un medicamento sustituto a Audiological scientist un formulario para que el seguro cubra el medicamento que se considera necesario.   Si se requiere una autorizacin previa para que su compaa de seguros [place] su medicamento, por favor permtanos de 1 a 2 das hbiles para completar [address]  Los precios de los medicamentos varan  con frecuencia dependiendo del lugar de dnde se surte la receta y alguna farmacias pueden ofrecer precios ms baratos.  El sitio web www.goodrx.com tiene cupones para medicamentos de Airline pilot. Los precios aqu no tienen en cuenta lo que podra costar con la ayuda del seguro (puede ser ms barato con su seguro), pero el sitio web puede darle el precio si no utiliz Research scientist (physical sciences).  - Puede imprimir el cupn correspondiente y llevarlo con su receta a la farmacia.  - Tambin puede pasar por nuestra oficina durante el horario de atencin regular y Charity fundraiser una tarjeta de cupones de GoodRx.  - Si necesita que su receta se enve electrnicamente a una farmacia diferente, informe a nuestra oficina a travs de MyChart de [place] o por telfono llamando al [phone] y presione la opcin 4.

## 2022-05-20 NOTE — Progress Notes (Signed)
Follow-Up Visit   Subjective  Katherine Montgomery is a 57 y.o. female who presents for the following: cyst (L mons pubis, pt presents for excision).  The following portions of the chart were reviewed this encounter and updated as appropriate:   Tobacco  Allergies  Meds  Problems  Med Hx  Surg Hx  Fam Hx     Review of Systems:  No other skin or systemic complaints except as noted in HPI or Assessment and Plan.  Objective  Well appearing patient in no apparent distress; mood and affect are within normal limits.  A focused examination was performed including groin. Relevant physical exam findings are noted in the Assessment and Plan.  L mons pubis Cystic pap 1.1cm   Assessment & Plan  Neoplasm of skin L mons pubis  Skin excision  Lesion length (cm):  1.1 Lesion width (cm):  1.1 Margin per side (cm):  0 Total excision diameter (cm):  1.1 Informed consent: discussed and consent obtained   Timeout: patient name, date of birth, surgical site, and procedure verified   Procedure prep:  Patient was prepped and draped in usual sterile fashion Prep type:  Isopropyl alcohol and povidone-iodine Anesthesia: the lesion was anesthetized in a standard fashion   Anesthetic:  1% lidocaine w/ epinephrine 1-100,000 buffered w/ 8.4% NaHCO3 (6cc lido w/ epi, 3cc bupivicaine, Total of 9cc) Instrument used comment:  #15c blade Hemostasis achieved with: pressure   Hemostasis achieved with comment:  Electrocautery Outcome: patient tolerated procedure well with no complications   Post-procedure details: sterile dressing applied and wound care instructions given   Dressing type: bandage, pressure dressing and bacitracin (Mupirocin)    Skin repair Complexity:  Complex Final length (cm):  1.5 Reason for type of repair: reduce tension to allow closure, reduce the risk of dehiscence, infection, and necrosis, reduce subcutaneous dead space and avoid a hematoma, allow closure of the large defect,  preserve normal anatomy, preserve normal anatomical and functional relationships and enhance both functionality and cosmetic results   Undermining: area extensively undermined   Undermining comment:  Undermining Defect 1.1cm Subcutaneous layers (deep stitches):  Suture size:  5-0 Suture type: Vicryl (polyglactin 910)   Subcutaneous suture technique: Inverted Dermal. Fine/surface layer approximation (top stitches):  Suture size:  5-0 Suture type: nylon   Stitches: horizontal mattress and simple interrupted   Stitches comment:  1 horizontal mattress, 2 simple interrupted Suture removal (days):  7 Hemostasis achieved with: pressure Outcome: patient tolerated procedure well with no complications   Post-procedure details: sterile dressing applied and wound care instructions given   Dressing type: bandage, pressure dressing and bacitracin (Mupirocin)    mupirocin ointment (BACTROBAN) 2 % Apply 1 Application topically daily. Qd to excision site  Specimen 1 - Surgical pathology Differential Diagnosis: D48.5 Cyst vs other  Check Margins: No Cystic pap 1.1cm  Cyst vs other, excised today Start Mupirocin oint qd to excision site Start Doxycyline 100mg  1 po bid with food and drink  Doxycycline should be taken with food to prevent nausea. Do not lay down for 30 minutes after taking. Be cautious with sun exposure and use good sun protection while on this medication. Pregnant women should not take this medication.     SEBORRHEIC DERMATITIS / SEBO PSORIASIS scalp Exam: thick scale post scalp, post auricular scalp  Treatment Plan: Cont Clobetasol sol 3d/wk aa scalp prn flares, avoid f/g/a Recommend Sal acyclic acid shampoo, T-Sal,  DHS Sal  Topical steroids (such as triamcinolone, fluocinolone, fluocinonide, mometasone, clobetasol,  halobetasol, betamethasone, hydrocortisone) can cause thinning and lightening of the skin if they are used for too long in the same area. Your physician has  selected the right strength medicine for your problem and area affected on the body. Please use your medication only as directed by your physician to prevent side effects.     Return in about 1 week (around 05/27/2022) for suture removal.  I, Ardis Rowan, RMA, am acting as scribe for Armida Sans, MD . Documentation: I have reviewed the above documentation for accuracy and completeness, and I agree with the above.  Armida Sans, MD

## 2022-05-20 NOTE — Telephone Encounter (Signed)
Left pt message to call if any problems after today's surgery./sh 

## 2022-05-27 ENCOUNTER — Ambulatory Visit: Payer: BC Managed Care – PPO | Admitting: Dermatology

## 2022-05-27 ENCOUNTER — Encounter: Payer: Self-pay | Admitting: Dermatology

## 2022-05-27 VITALS — BP 98/62

## 2022-05-27 DIAGNOSIS — L729 Follicular cyst of the skin and subcutaneous tissue, unspecified: Secondary | ICD-10-CM

## 2022-05-27 DIAGNOSIS — Z4802 Encounter for removal of sutures: Secondary | ICD-10-CM

## 2022-05-27 NOTE — Patient Instructions (Signed)
Due to recent changes in healthcare laws, you may see results of your pathology and/or laboratory studies on MyChart before the doctors have had a chance to review them. We understand that in some cases there may be results that are confusing or concerning to you. Please understand that not all results are received at the same time and often the doctors may need to interpret multiple results in order to provide you with the best plan of care or course of treatment. Therefore, we ask that you please give us 2 business days to thoroughly review all your results before contacting the office for clarification. Should we see a critical lab result, you will be contacted sooner.   If You Need Anything After Your Visit  If you have any questions or concerns for your doctor, please call our main line at [phone] and press option 4 to reach your doctor's medical assistant. If no one answers, please leave a voicemail as directed and we will return your call as soon as possible. Messages left after 4 pm will be answered the following business day.   You may also send us a message via MyChart. We typically respond to MyChart messages within 1-2 business days.  For prescription refills, please ask your pharmacy to contact our office. Our fax number is [phone].  If you have an urgent issue when the clinic is closed that cannot wait until the next business day, you can page your doctor at the number below.    Please note that while we do our best to be available for urgent issues outside of office hours, we are not available 24/7.   If you have an urgent issue and are unable to reach us, you may choose to seek medical care at your doctor's office, retail clinic, urgent care center, or emergency room.  If you have a medical emergency, please immediately call 911 or go to the emergency department.  Pager Numbers  - Dr. Kowalski: [phone]  - Dr. Moye: [phone]  - Dr. Stewart:  [phone]  In the event of inclement weather, please call our main line at [phone] for an update on the status of any delays or closures.  Dermatology Medication Tips: Please keep the boxes that topical medications come in in order to help keep track of the instructions about where and how to use these. Pharmacies typically print the medication instructions only on the boxes and not directly on the medication tubes.   If your medication is too expensive, please contact our office at [phone] option 4 or send us a message through MyChart.   We are unable to tell what your co-pay for medications will be in advance as this is different depending on your insurance coverage. However, we may be able to find a substitute medication at lower cost or fill out paperwork to get insurance to cover a needed medication.   If a prior authorization is required to get your medication covered by your insurance company, please allow us 1-2 business days to complete this process.  Drug prices often vary depending on where the prescription is filled and some pharmacies may offer cheaper prices.  The website www.goodrx.com contains coupons for medications through different pharmacies. The prices here do not account for what the cost may be with help from insurance (it may be cheaper with your insurance), but the website can give you the price if you did not use any insurance.  - You can print the associated coupon and take it with   your prescription to the pharmacy.  - You may also stop by our office during regular business hours and pick up a GoodRx coupon card.  - If you need your prescription sent electronically to a different pharmacy, notify our office through [facility] MyChart or by phone at [phone] option 4.     Si Usted Necesita Algo Despus de Su Visita  Tambin puede enviarnos un mensaje a travs de MyChart. Por lo general respondemos a los mensajes de MyChart en el transcurso de 1 a 2  das hbiles.  Para renovar recetas, por favor pida a su farmacia que se ponga en contacto con nuestra oficina. Nuestro nmero de fax es el [phone].  Si tiene un asunto urgente cuando la clnica est cerrada y que no puede esperar hasta el siguiente da hbil, puede llamar/localizar a su doctor(a) al nmero que aparece a continuacin.   Por favor, tenga en cuenta que aunque hacemos todo lo posible para estar disponibles para asuntos urgentes fuera del horario de oficina, no estamos disponibles las 24 horas del da, los 7 das de la semana.   Si tiene un problema urgente y no puede comunicarse con nosotros, puede optar por buscar atencin mdica  en el consultorio de su doctor(a), en una clnica privada, en un centro de atencin urgente o en una sala de emergencias.  Si tiene una emergencia mdica, por favor llame inmediatamente al 911 o vaya a la sala de emergencias.  Nmeros de bper  - Dr. Kowalski: [phone]  - Dra. Moye: [phone]  - Dra. Stewart: [phone]  En caso de inclemencias del tiempo, por favor llame a nuestra lnea principal al [phone] para una actualizacin sobre el estado de cualquier retraso o cierre.  Consejos para la medicacin en dermatologa: Por favor, guarde las cajas en las que vienen los medicamentos de uso tpico para ayudarle a seguir las instrucciones sobre dnde y cmo usarlos. Las farmacias generalmente imprimen las instrucciones del medicamento slo en las cajas y no directamente en los tubos del medicamento.   Si su medicamento es muy caro, por favor, pngase en contacto con nuestra oficina llamando al [phone] y presione la opcin 4 o envenos un mensaje a travs de MyChart.   No podemos decirle cul ser su copago por los medicamentos por adelantado ya que esto es diferente dependiendo de la cobertura de su seguro. Sin embargo, es posible que podamos encontrar un medicamento sustituto a menor costo o llenar un formulario para que el  seguro cubra el medicamento que se considera necesario.   Si se requiere una autorizacin previa para que su compaa de seguros cubra su medicamento, por favor permtanos de 1 a 2 das hbiles para completar este proceso.  Los precios de los medicamentos varan con frecuencia dependiendo del lugar de dnde se surte la receta y alguna farmacias pueden ofrecer precios ms baratos.  El sitio web www.goodrx.com tiene cupones para medicamentos de diferentes farmacias. Los precios aqu no tienen en cuenta lo que podra costar con la ayuda del seguro (puede ser ms barato con su seguro), pero el sitio web puede darle el precio si no utiliz ningn seguro.  - Puede imprimir el cupn correspondiente y llevarlo con su receta a la farmacia.  - Tambin puede pasar por nuestra oficina durante el horario de atencin regular y recoger una tarjeta de cupones de GoodRx.  - Si necesita que su receta se enve electrnicamente a una farmacia diferente, informe a nuestra oficina a travs de MyChart de [facility]   o por telfono llamando al [phone] y presione la opcin 4.  

## 2022-05-27 NOTE — Progress Notes (Signed)
   Follow-Up Visit   Subjective  Katherine Montgomery is a 57 y.o. female who presents for the following: Suture removal of bx proven cyst L mons pubis  Pathology showed Cyst  The following portions of the chart were reviewed this encounter and updated as appropriate: medications, allergies, medical history  Review of Systems:  No other skin or systemic complaints except as noted in HPI or Assessment and Plan.  Objective  Well appearing patient in no apparent distress; mood and affect are within normal limits.  Areas Examined: groin Relevant physical exam findings are noted in the Assessment and Plan.   Assessment & Plan    Encounter for Removal of Sutures - Incision site is clean, dry and intact. - Wound cleansed, sutures removed, wound cleansed and steri strips applied.  - Discussed pathology results showing Cyst - Patient advised to keep steri-strips dry until they fall off. - Scars remodel for a full year. - Once steri-strips fall off, patient can apply over-the-counter silicone scar cream once to twice a day to help with scar remodeling if desired. - Patient advised to call with any concerns or if they notice any new or changing lesions.  Return for as scheduled for TBSE.  Armida Sans  Documentation: I have reviewed the above documentation for accuracy and completeness, and I agree with the above.  Armida Sans, MD

## 2022-06-01 ENCOUNTER — Encounter: Payer: Self-pay | Admitting: Dermatology

## 2022-11-24 ENCOUNTER — Other Ambulatory Visit: Payer: Self-pay | Admitting: Family Medicine

## 2022-11-24 DIAGNOSIS — Z1231 Encounter for screening mammogram for malignant neoplasm of breast: Secondary | ICD-10-CM

## 2022-12-04 ENCOUNTER — Ambulatory Visit
Admission: RE | Admit: 2022-12-04 | Discharge: 2022-12-04 | Disposition: A | Discharge status: Home / Self Care | Payer: BC Managed Care – PPO | Source: Ambulatory Visit | Attending: Family Medicine | Admitting: Family Medicine

## 2022-12-04 DIAGNOSIS — Z1231 Encounter for screening mammogram for malignant neoplasm of breast: Secondary | ICD-10-CM | POA: Diagnosis present

## 2023-03-04 IMAGING — US US PELVIS COMPLETE WITH TRANSVAGINAL
2 series · 13 of 25 positions shown · non-contrast
Comparison: None available.

CLINICAL DATA: Initial evaluation for postmenopausal bleeding.



[Series 1: us pelvic complete with transvaginal · 12 of 99 slices shown (1 of 2)]
[im 1/99]
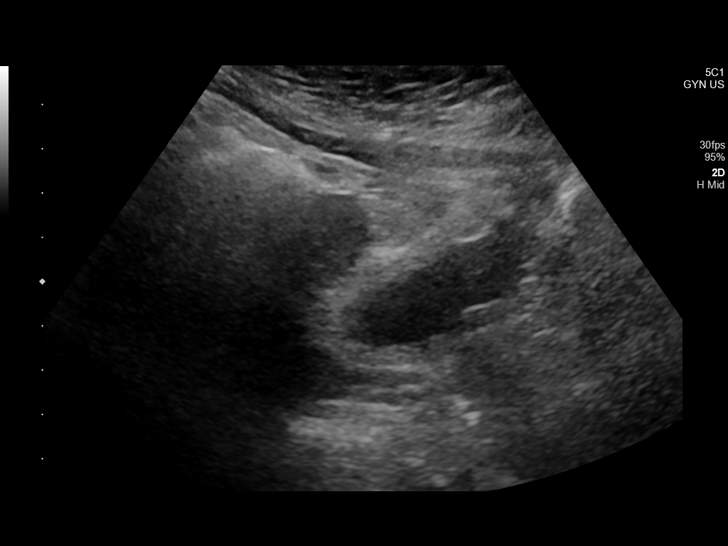
[im 9/99]
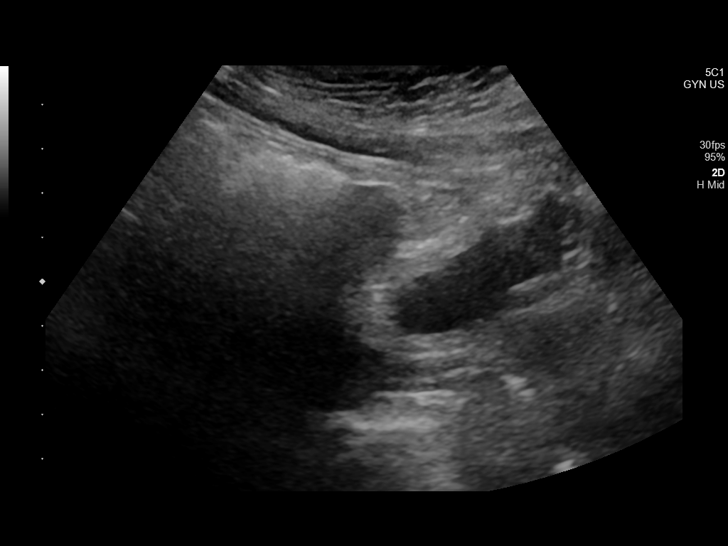
[im 18/99]
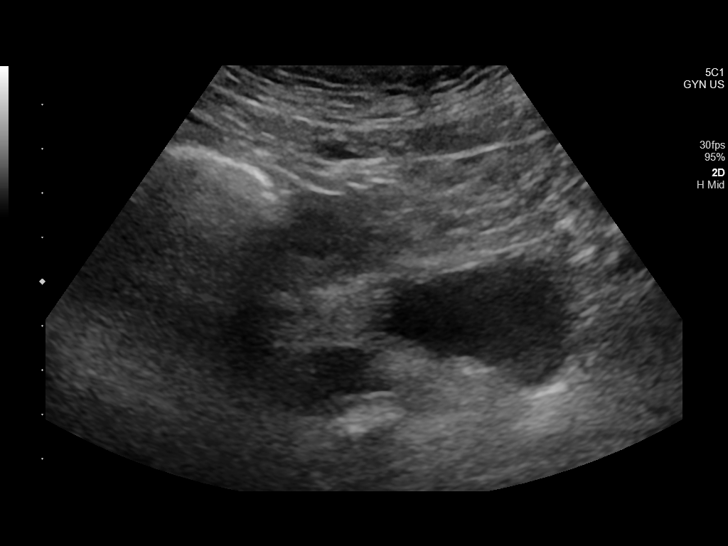
[im 26/99]
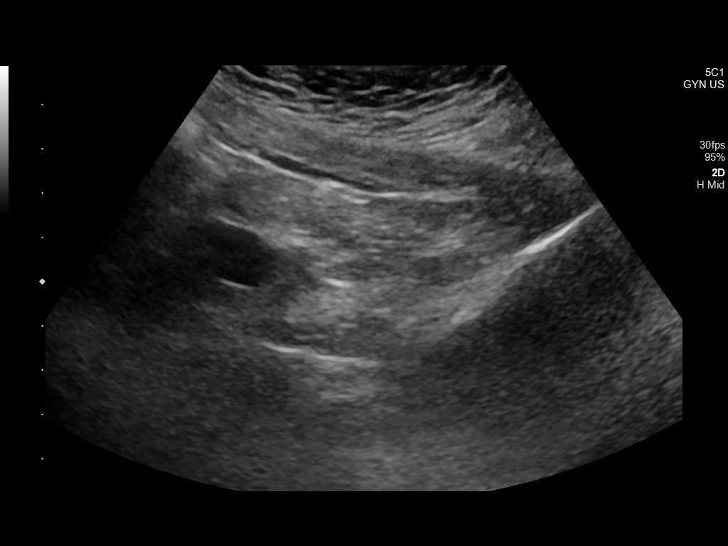
[im 35/99]
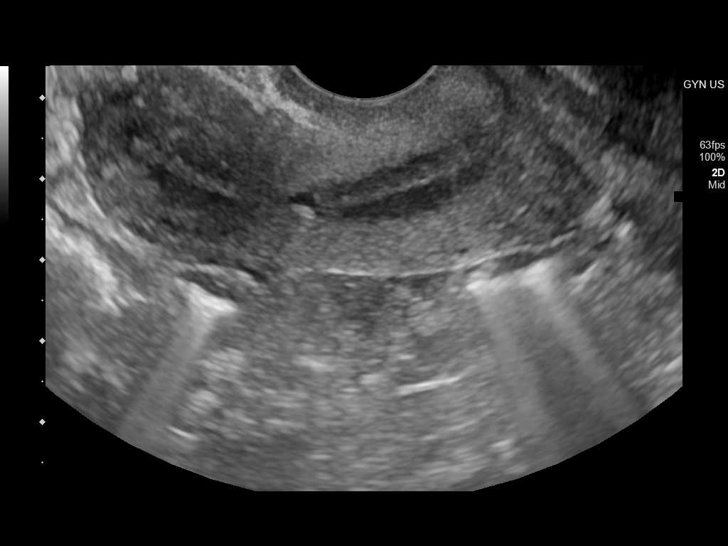
[im 43/99]
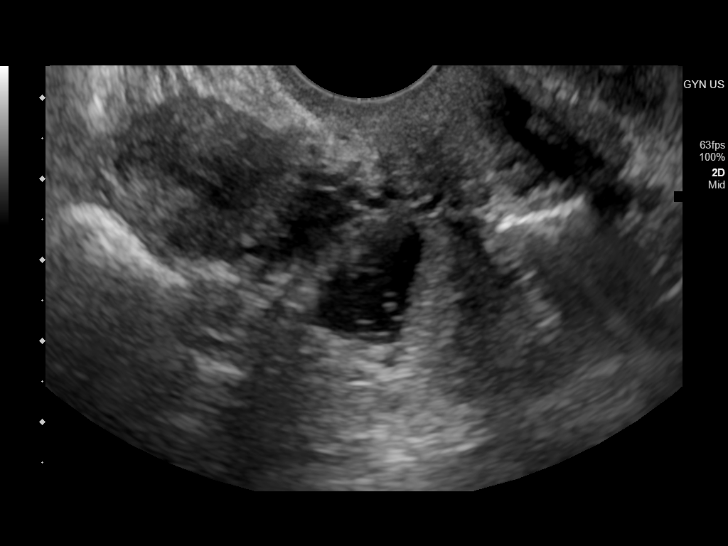
[im 52/99]
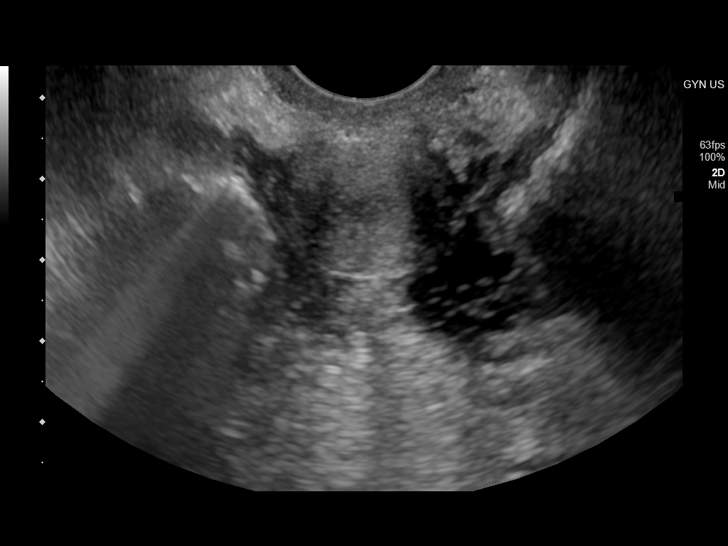
[im 60/99]
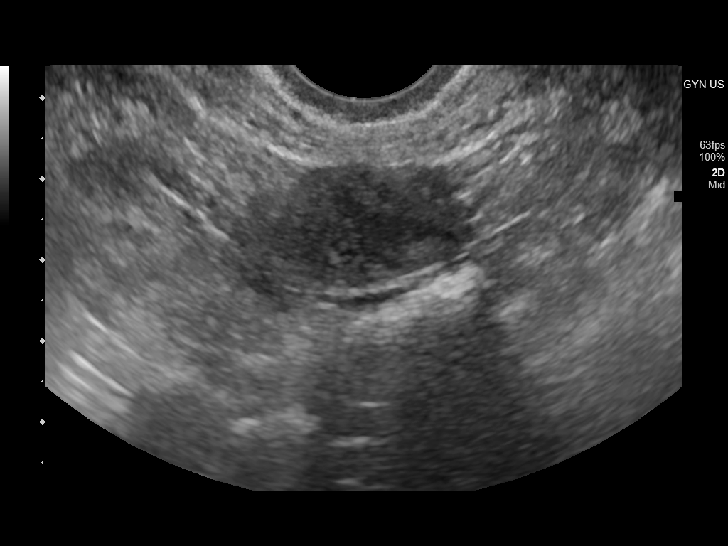
[im 69/99]
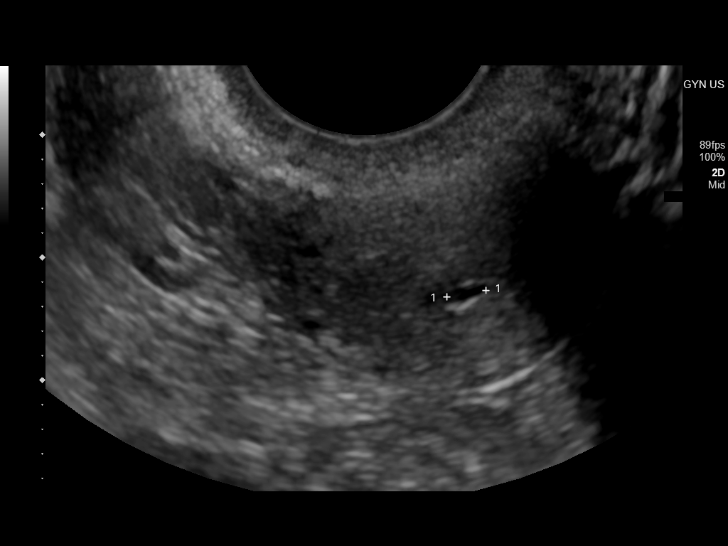
[im 77/99]
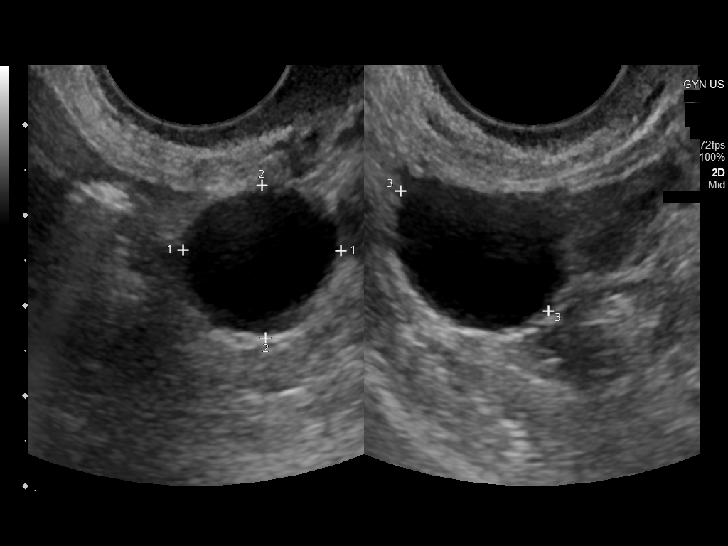
[im 86/99]
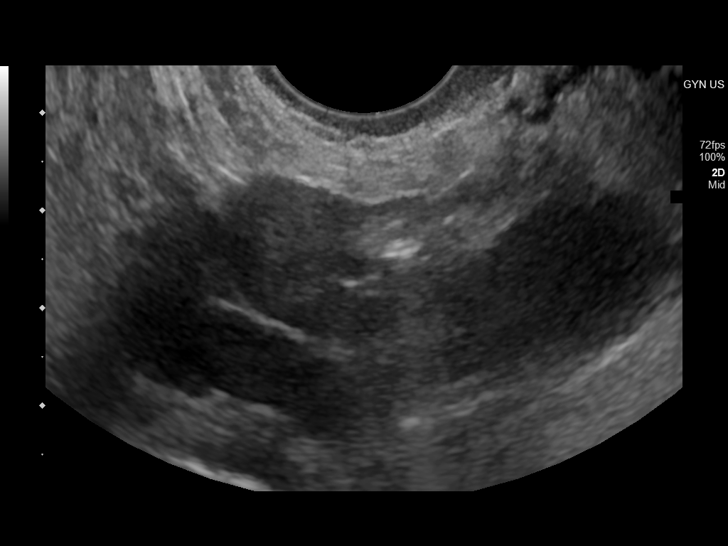
[im 94/99]
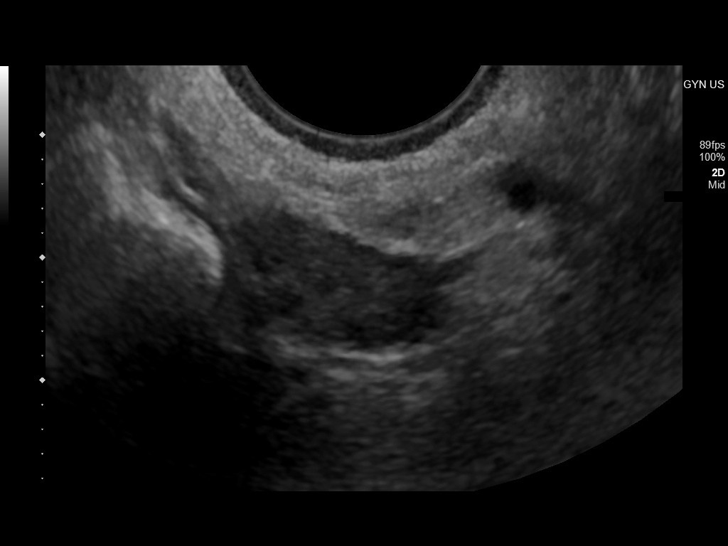

[Series 1001: us pelvic complete with transvaginal · 1 of 2 slices shown (2 of 2)]
[im 1/2]
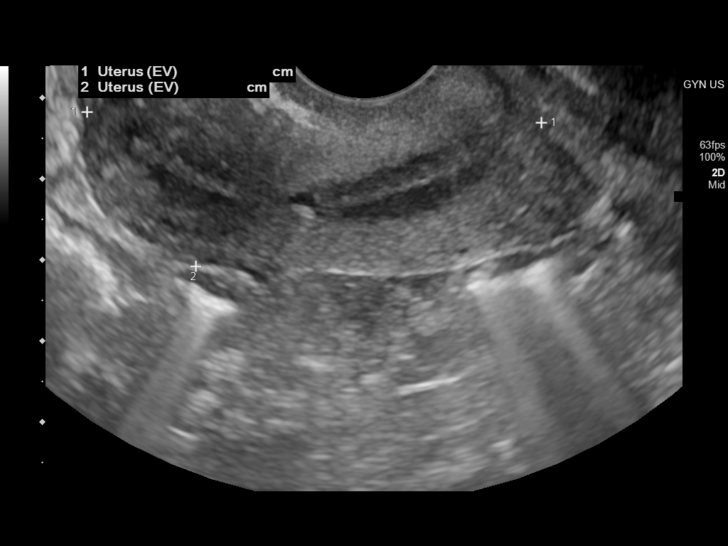

[13 of 25 positions shown; findings below may reference images not displayed]

FINDINGS: Uterus

Measurements: 5.6 x 2.3 x 3.2 cm = volume: 22 mL. Uterus is
anteverted. No discrete fibroid or other mass.

Endometrium

Thickness: 1.2 mm. No focal abnormality. Tiny 3 mm simple
subendometrial cyst noted at the level of the lower uterine segment,
of doubtful significance.

Right ovary

Measurements: 1.9 x 1.3 x 1.1 cm = volume: 1.0 mL. 1.8 x 1.7 x
cm simple cyst seen within the right adnexa. This appears to be
separate from the right ovary. No internal complexity, vascularity,
or solid component.

Left ovary

Measurements: 1.8 x 1.0 x 1.6 cm = volume: 1.0 mL. Normal
appearance/no adnexal mass.

Other findings

No abnormal free fluid.
IMPRESSION: 1. Endometrial stripe thin measuring 1.2 mm. In the setting of
post-menopausal bleeding, this is consistent with a benign etiology
such as endometrial atrophy. If bleeding remains unresponsive to
hormonal or medical therapy, sonohysterogram should be considered
for focal lesion work-up. (Ref: Radiological Reasoning: Algorithmic
Workup of Abnormal Vaginal Bleeding with Endovaginal Sonography and
Sonohysterography. AJR [id number]; 191:S68-73).
2. Otherwise normal sonographic appearance of the uterus.
3. 2.1 cm simple right adnexal cyst, almost certainly benign given
size. No followup imaging recommended. Note: This recommendation
does not apply to premenarchal patients or to those with increased
risk (genetic, family history, elevated tumor markers or other
high-risk factors) of ovarian cancer. Reference: Radiology [DATE]. Otherwise normal sonographic appearance of the ovaries. No free
fluid.

## 2023-03-06 ENCOUNTER — Other Ambulatory Visit: Payer: Self-pay | Admitting: Family Medicine

## 2023-03-06 DIAGNOSIS — E7800 Pure hypercholesterolemia, unspecified: Secondary | ICD-10-CM

## 2023-03-17 ENCOUNTER — Ambulatory Visit
Admission: RE | Admit: 2023-03-17 | Discharge: 2023-03-17 | Disposition: A | Discharge status: Home / Self Care | Payer: Self-pay | Source: Ambulatory Visit | Attending: Family Medicine | Admitting: Family Medicine

## 2023-03-17 DIAGNOSIS — E7800 Pure hypercholesterolemia, unspecified: Secondary | ICD-10-CM | POA: Insufficient documentation

## 2023-04-22 ENCOUNTER — Ambulatory Visit: Payer: BC Managed Care – PPO | Admitting: Dermatology

## 2023-05-18 ENCOUNTER — Ambulatory Visit: Admitting: Dermatology

## 2023-05-25 ENCOUNTER — Ambulatory Visit: Admitting: Dermatology

## 2023-05-25 ENCOUNTER — Encounter: Payer: Self-pay | Admitting: Dermatology

## 2023-05-25 DIAGNOSIS — L219 Seborrheic dermatitis, unspecified: Secondary | ICD-10-CM

## 2023-05-25 DIAGNOSIS — L408 Other psoriasis: Secondary | ICD-10-CM | POA: Diagnosis not present

## 2023-05-25 DIAGNOSIS — Z86018 Personal history of other benign neoplasm: Secondary | ICD-10-CM

## 2023-05-25 DIAGNOSIS — L853 Xerosis cutis: Secondary | ICD-10-CM

## 2023-05-25 DIAGNOSIS — L578 Other skin changes due to chronic exposure to nonionizing radiation: Secondary | ICD-10-CM

## 2023-05-25 DIAGNOSIS — L609 Nail disorder, unspecified: Secondary | ICD-10-CM | POA: Diagnosis not present

## 2023-05-25 DIAGNOSIS — Z1283 Encounter for screening for malignant neoplasm of skin: Secondary | ICD-10-CM | POA: Diagnosis not present

## 2023-05-25 DIAGNOSIS — D1801 Hemangioma of skin and subcutaneous tissue: Secondary | ICD-10-CM

## 2023-05-25 DIAGNOSIS — Z7189 Other specified counseling: Secondary | ICD-10-CM

## 2023-05-25 DIAGNOSIS — W908XXA Exposure to other nonionizing radiation, initial encounter: Secondary | ICD-10-CM

## 2023-05-25 DIAGNOSIS — L821 Other seborrheic keratosis: Secondary | ICD-10-CM

## 2023-05-25 DIAGNOSIS — D229 Melanocytic nevi, unspecified: Secondary | ICD-10-CM

## 2023-05-25 DIAGNOSIS — L814 Other melanin hyperpigmentation: Secondary | ICD-10-CM

## 2023-05-25 DIAGNOSIS — Z79899 Other long term (current) drug therapy: Secondary | ICD-10-CM

## 2023-05-25 MED ORDER — CLOBETASOL PROPIONATE 0.05 % EX SOLN: 1.0000 | CUTANEOUS | 5 refills | Status: AC

## 2023-05-25 MED ORDER — ZORYVE 0.3 % EX FOAM: 1.0000 "application " | CUTANEOUS | 5 refills | Status: AC

## 2023-05-25 NOTE — Progress Notes (Signed)
 Follow-Up Visit   Subjective  Katherine Montgomery is a 58 y.o. female who presents for the following: Skin Cancer Screening and Full Body Skin Exam  The patient presents for Total-Body Skin Exam (TBSE) for skin cancer screening and mole check. The patient has spots, moles and lesions to be evaluated, some may be new or changing and the patient may have concern these could be cancer.  Patient has taken Lamisil  in the past for toe fungus and would like to know if there is a cream she can get. The pills cleared nails but now she thinks it could be coming back on another nail. Hx DN.   The following portions of the chart were reviewed this encounter and updated as appropriate: medications, allergies, medical history  Review of Systems:  No other skin or systemic complaints except as noted in HPI or Assessment and Plan.  Objective  Well appearing patient in no apparent distress; mood and affect are within normal limits.  A full examination was performed including scalp, head, eyes, ears, nose, lips, neck, chest, axillae, abdomen, back, buttocks, bilateral upper extremities, bilateral lower extremities, hands, feet, fingers, toes, fingernails, and toenails. All findings within normal limits unless otherwise noted below.   Relevant physical exam findings are noted in the Assessment and Plan.    Assessment & Plan   SKIN CANCER SCREENING PERFORMED TODAY.  ACTINIC DAMAGE - Chronic condition, secondary to cumulative UV/sun exposure - diffuse scaly erythematous macules with underlying dyspigmentation - Recommend daily broad spectrum sunscreen SPF 30+ to sun-exposed areas, reapply every 2 hours as needed.  - Staying in the shade or wearing long sleeves, sun glasses (UVA+UVB protection) and wide brim hats (4-inch brim around the entire circumference of the hat) are also recommended for sun protection.  - Call for new or changing lesions.  LENTIGINES, SEBORRHEIC KERATOSES, HEMANGIOMAS - Benign normal  skin lesions - Benign-appearing - Call for any changes  MELANOCYTIC NEVI - Tan-brown and/or pink-flesh-colored symmetric macules and papules - Benign appearing on exam today - Observation - Call clinic for new or changing moles - Recommend daily use of broad spectrum spf 30+ sunscreen to sun-exposed areas.   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  - Call if any new or changing lesions are noted between office visits   SEBORRHEIC DERMATITIS/SEBOPSORIASIS/PSORIASIS Exam: Pink patches with greasy scale at scalp Seborrheic Dermatitis is a chronic persistent rash characterized by pinkness and scaling most commonly of the mid face but also can occur on the scalp (dandruff), ears; mid chest, mid back and groin.  It tends to be exacerbated by stress and cooler weather.  People who have neurologic disease may experience new onset or exacerbation of existing seborrheic dermatitis.  The condition is not curable but treatable and can be controlled. Treatment Plan: Start Zoryve  0.3% foam 3x weekly to scalp Continue clobetasol  0.05% solution to spot treat affected areas. Avoid applying to face, groin, and axilla. Use as directed. Long-term use can cause thinning of the skin.  Topical steroids (such as triamcinolone, fluocinolone, fluocinonide, mometasone , clobetasol , halobetasol, betamethasone, hydrocortisone) can cause thinning and lightening of the skin if they are used for too long in the same area. Your physician has selected the right strength medicine for your problem  and area affected on the body. Please use your medication only as directed by your physician to prevent side effects.   Xerosis - diffuse xerotic  patches - recommend gentle, hydrating skin care - gentle skin care handout given   SEBORRHEIC DERMATITIS   Related Medications ketoconazole  (NIZORAL ) 2 % shampoo Apply 1 application topically 2 (two) times a week. Let sit 5 minutes and rinse out Roflumilast  (ZORYVE ) 0.3 % FOAM Apply 1 application  topically 3 (three) times a week. NAIL PROBLEM Left 2nd Toenail Clipping from left 2nd toenail performed and sent for molecular study Return in about 1 year (around 05/24/2024) for TBSE, with Dr. Linnell Richardson, HxDN.  Kerstin Peeling, RMA, am acting as scribe for Celine Collard, MD .   Documentation: I have reviewed the above documentation for accuracy and completeness, and I agree with the above.  Celine Collard, MD

## 2023-05-25 NOTE — Patient Instructions (Signed)

## 2023-05-28 ENCOUNTER — Telehealth: Payer: Self-pay

## 2023-05-28 ENCOUNTER — Encounter: Payer: Self-pay | Admitting: Dermatology

## 2023-05-28 MED ORDER — TERBINAFINE HCL 250 MG PO TABS: 250.0000 mg | ORAL_TABLET | Freq: Every day | ORAL | 0 refills | Status: AC

## 2023-05-28 NOTE — Telephone Encounter (Signed)
 Patient advised per Dr. 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  Pts lab from 01/2023 showed normal liver tests.  If pt wants to treat fungus, may send oral Lamisil  250 mg 1 po every day #30 0rf to pharmacy.  After taking 1 month, she should send myChart message to let us  know how tolerated and we may send 2 mos refill for total 3 mos and recheck next visit"  Patient would like rx sent in to [facility] and will let us  know in 1 month how she tolerated Lamisil .   Sue Em., RMA

## 2023-11-25 ENCOUNTER — Other Ambulatory Visit: Payer: Self-pay | Admitting: Obstetrics and Gynecology

## 2023-11-25 ENCOUNTER — Encounter: Payer: Self-pay | Admitting: Obstetrics and Gynecology

## 2023-11-25 DIAGNOSIS — Z1231 Encounter for screening mammogram for malignant neoplasm of breast: Secondary | ICD-10-CM

## 2023-12-28 ENCOUNTER — Ambulatory Visit
Admission: RE | Admit: 2023-12-28 | Discharge: 2023-12-28 | Disposition: A | Discharge status: Home / Self Care | Source: Ambulatory Visit | Attending: Obstetrics and Gynecology | Admitting: Obstetrics and Gynecology

## 2023-12-28 DIAGNOSIS — Z1231 Encounter for screening mammogram for malignant neoplasm of breast: Secondary | ICD-10-CM | POA: Diagnosis present

## 2024-05-31 ENCOUNTER — Ambulatory Visit: Admitting: Dermatology
